# Patient Record
Sex: Female | Born: 1995 | Race: White | Hispanic: No | Marital: Single | Smoking: Never smoker
Health system: Southern US, Community
[De-identification: ages and names within clinical notes are randomized; demographics above are authoritative.]

## PROBLEM LIST (undated history)

## (undated) ENCOUNTER — Emergency Department: Discharge status: Absent without leave | Payer: Medicare Other

## (undated) ENCOUNTER — Inpatient Hospital Stay: Payer: Self-pay

## (undated) DIAGNOSIS — H9190 Unspecified hearing loss, unspecified ear: Secondary | ICD-10-CM

## (undated) DIAGNOSIS — H9191 Unspecified hearing loss, right ear: Secondary | ICD-10-CM

## (undated) HISTORY — PX: COCHLEAR IMPLANT: SUR684

## (undated) HISTORY — PX: WISDOM TOOTH EXTRACTION: SHX21

## (undated) HISTORY — DX: Unspecified hearing loss, right ear: H91.91

## (undated) HISTORY — DX: Unspecified hearing loss, unspecified ear: H91.90

---
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
## 2015-02-28 ENCOUNTER — Emergency Department
Admission: EM | Admit: 2015-02-28 | Discharge: 2015-02-28 | Disposition: A | Discharge status: Home / Self Care | Payer: Medicaid Other | Attending: Emergency Medicine | Admitting: Emergency Medicine

## 2015-02-28 ENCOUNTER — Encounter: Payer: Self-pay | Admitting: *Deleted

## 2015-02-28 DIAGNOSIS — Z3A01 Less than 8 weeks gestation of pregnancy: Secondary | ICD-10-CM | POA: Insufficient documentation

## 2015-02-28 DIAGNOSIS — K5900 Constipation, unspecified: Secondary | ICD-10-CM | POA: Diagnosis not present

## 2015-02-28 DIAGNOSIS — O99611 Diseases of the digestive system complicating pregnancy, first trimester: Secondary | ICD-10-CM | POA: Diagnosis not present

## 2015-02-28 DIAGNOSIS — O2341 Unspecified infection of urinary tract in pregnancy, first trimester: Secondary | ICD-10-CM | POA: Diagnosis not present

## 2015-02-28 DIAGNOSIS — N390 Urinary tract infection, site not specified: Secondary | ICD-10-CM

## 2015-02-28 DIAGNOSIS — O210 Mild hyperemesis gravidarum: Secondary | ICD-10-CM | POA: Diagnosis not present

## 2015-02-28 DIAGNOSIS — Z3490 Encounter for supervision of normal pregnancy, unspecified, unspecified trimester: Secondary | ICD-10-CM

## 2015-02-28 DIAGNOSIS — R112 Nausea with vomiting, unspecified: Secondary | ICD-10-CM

## 2015-02-28 LAB — CBC WITH DIFFERENTIAL/PLATELET
Basophils Absolute: 0.1 10*3/uL (ref 0–0.1)
Basophils Relative: 1 %
EOS ABS: 0 10*3/uL (ref 0–0.7)
Eosinophils Relative: 0 %
HCT: 40.7 % (ref 35.0–47.0)
Hemoglobin: 13.7 g/dL (ref 12.0–16.0)
LYMPHS ABS: 1.2 10*3/uL (ref 1.0–3.6)
Lymphocytes Relative: 9 %
MCH: 30.2 pg (ref 26.0–34.0)
MCHC: 33.6 g/dL (ref 32.0–36.0)
MCV: 90 fL (ref 80.0–100.0)
MONOS PCT: 6 %
Monocytes Absolute: 0.8 10*3/uL (ref 0.2–0.9)
Neutro Abs: 11.7 10*3/uL — ABNORMAL HIGH (ref 1.4–6.5)
Neutrophils Relative %: 84 %
Platelets: 277 10*3/uL (ref 150–440)
RBC: 4.52 MIL/uL (ref 3.80–5.20)
RDW: 13.7 % (ref 11.5–14.5)
WBC: 13.9 10*3/uL — AB (ref 3.6–11.0)

## 2015-02-28 LAB — HCG, QUANTITATIVE, PREGNANCY: HCG, BETA CHAIN, QUANT, S: 92438 m[IU]/mL — AB (ref <5)

## 2015-02-28 LAB — URINALYSIS COMPLETE WITH MICROSCOPIC (ARMC ONLY)
Bilirubin Urine: NEGATIVE
GLUCOSE, UA: NEGATIVE mg/dL
Hgb urine dipstick: NEGATIVE
Nitrite: NEGATIVE
PROTEIN: 30 mg/dL — AB
Specific Gravity, Urine: 1.028 (ref 1.005–1.030)
pH: 6 (ref 5.0–8.0)

## 2015-02-28 LAB — LIPASE, BLOOD: Lipase: 17 U/L (ref 11–51)

## 2015-02-28 LAB — COMPREHENSIVE METABOLIC PANEL
ALK PHOS: 48 U/L (ref 38–126)
ALT: 12 U/L — AB (ref 14–54)
AST: 14 U/L — AB (ref 15–41)
Albumin: 4.3 g/dL (ref 3.5–5.0)
Anion gap: 5 (ref 5–15)
BUN: 8 mg/dL (ref 6–20)
CALCIUM: 9.6 mg/dL (ref 8.9–10.3)
CO2: 27 mmol/L (ref 22–32)
CREATININE: 0.53 mg/dL (ref 0.44–1.00)
Chloride: 104 mmol/L (ref 101–111)
GFR calc non Af Amer: >60 mL/min (ref >60)
GLUCOSE: 96 mg/dL (ref 65–99)
Potassium: 3.5 mmol/L (ref 3.5–5.1)
SODIUM: 136 mmol/L (ref 135–145)
Total Bilirubin: 1 mg/dL (ref 0.3–1.2)
Total Protein: 7.8 g/dL (ref 6.5–8.1)

## 2015-02-28 LAB — POCT PREGNANCY, URINE: PREG TEST UR: POSITIVE — AB

## 2015-02-28 MED ORDER — METOCLOPRAMIDE HCL 10 MG PO TABS: 10.0000 mg | ORAL_TABLET | Freq: Three times a day (TID) | ORAL | Status: DC | PRN

## 2015-02-28 MED ORDER — NITROFURANTOIN MONOHYD MACRO 100 MG PO CAPS: 100.0000 mg | ORAL_CAPSULE | Freq: Two times a day (BID) | ORAL | Status: AC

## 2015-02-28 NOTE — ED Provider Notes (Signed)
[facility] Emergency Department Provider Note    ____________________________________________  Time seen: 1515  I have reviewed the triage vital signs and the nursing notes.   HISTORY  Chief Complaint Constipation   History limited by: Not Limited   HPI Abigail Curry is a 20 y.o. female who presented to the emergency department today with primary concern of constipation. She states thatshe has been having a more difficult time with defecation the past roughly week. She states her last bowel movement was 2 days ago. She states that she had a small amount of stooling at that time. The patient has had some midline lower abdominal pain as well. In addition patient states she was thrown about the past 5 days. She has also had some dysuria. She denies any fevers.   History reviewed. No pertinent past medical history.  There are no active problems to display for this patient.   Past Surgical History  Procedure Laterality Date  . Cochlear implant      Current Outpatient Rx  Name  Route  Sig  Dispense  Refill  . metoCLOPramide (REGLAN) 10 MG tablet   Oral   Take 1 tablet (10 mg total) by mouth 3 (three) times daily as needed for nausea or vomiting.   90 tablet   1   . nitrofurantoin, macrocrystal-monohydrate, (MACROBID) 100 MG capsule   Oral   Take 1 capsule (100 mg total) by mouth 2 (two) times daily.   14 capsule   0     Allergies Review of patient's allergies indicates no known allergies.  No family history on file.  Social History Social History  Substance Use Topics  . Smoking status: Never Smoker   . Smokeless tobacco: None  . Alcohol Use: Yes    Review of Systems  Constitutional: Negative for fever. Cardiovascular: Negative for chest pain. Respiratory: Negative for shortness of breath. Gastrointestinal: Positive for lower abdominal pain, emesis. Positive for constipation. Neurological: Negative for headaches, focal  weakness or numbness.   10-point ROS otherwise negative.  ____________________________________________   PHYSICAL EXAM:  VITAL SIGNS: ED Triage Vitals  Enc Vitals Group     BP 02/28/15 1231 124/83 mmHg     Pulse Rate 02/28/15 1231 86     Resp 02/28/15 1231 20     Temp 02/28/15 1231 97.4 F (36.3 C)     Temp Source 02/28/15 1231 Oral     SpO2 02/28/15 1231 99 %     Weight 02/28/15 1231 160 lb (72.576 kg)     Height 02/28/15 1231 5\' 7"  (1.702 m)     Head Cir --      Peak Flow --      Pain Score 02/28/15 1235 7   Constitutional: Alert and oriented. Well appearing and in no distress. Eyes: Conjunctivae are normal. PERRL. Normal extraocular movements. ENT   Head: Normocephalic and atraumatic.   Nose: No congestion/rhinnorhea.   Mouth/Throat: Mucous membranes are moist.   Neck: No stridor. Hematological/Lymphatic/Immunilogical: No cervical lymphadenopathy. Cardiovascular: Normal rate, regular rhythm.  No murmurs, rubs, or gallops. Respiratory: Normal respiratory effort without tachypnea nor retractions. Breath sounds are clear and equal bilaterally. No wheezes/rales/rhonchi. Gastrointestinal: Soft and nontender. No distention. There is no CVA tenderness. Genitourinary: Deferred Musculoskeletal: Normal range of motion in all extremities. No joint effusions.  No lower extremity tenderness nor edema. Neurologic:  Normal speech and language. No gross focal neurologic deficits are appreciated.  Skin:  Skin is warm, dry and intact. No rash noted.  Psychiatric: Mood and affect are normal. Speech and behavior are normal. Patient exhibits appropriate insight and judgment.  ____________________________________________    LABS (pertinent positives/negatives)  Labs Reviewed  CBC WITH DIFFERENTIAL/PLATELET - Abnormal; Notable for the following:    WBC 13.9 (*)    Neutro Abs 11.7 (*)    All other components within normal limits  COMPREHENSIVE METABOLIC PANEL - Abnormal;  Notable for the following:    AST 14 (*)    ALT 12 (*)    All other components within normal limits  URINALYSIS COMPLETEWITH MICROSCOPIC ([facility] ONLY) - Abnormal; Notable for the following:    Color, Urine YELLOW (*)    APPearance HAZY (*)    Ketones, ur 1+ (*)    Protein, ur 30 (*)    Leukocytes, UA 1+ (*)    Bacteria, UA RARE (*)    Squamous Epithelial / LPF 0-5 (*)    All other components within normal limits  HCG, QUANTITATIVE, PREGNANCY - Abnormal; Notable for the following:    hCG, Beta Chain, Quant, S 92438 (*)    All other components within normal limits  POCT PREGNANCY, URINE - Abnormal; Notable for the following:    Preg Test, Ur POSITIVE (*)    All other components within normal limits  LIPASE, BLOOD     ____________________________________________   EKG  None  ____________________________________________    RADIOLOGY  None   ____________________________________________   PROCEDURES  Procedure(s) performed: None  Critical Care performed: No  ____________________________________________   INITIAL IMPRESSION / ASSESSMENT AND PLAN / ED COURSE  Pertinent labs & imaging results that were available during my care of the patient were reviewed by me and considered in my medical decision making (see chart for details).  Patient presented to the emergency department today because of concerns for constipation. On review of systems patient is also been having some nausea and lower abdominal pain. In addition she has had some dysuria. Notable findings include a positive pregnancy and UTI. Patient was unaware for pregnancy. I discussed with the patient the importance of prenatal vitamins and OB/GYN follow-up. Patient did not have any tenderness on exam was not complaining of any significant pain and thus I doubt ectopic likely at this point. Additionally patient has had no vaginal bleeding. Furthermore the patient does have a UTI which could explain some the  abdominal discomfort.   ____________________________________________   FINAL CLINICAL IMPRESSION(S) / ED DIAGNOSES  Final diagnoses:  Pregnancy  Nausea and vomiting, vomiting of unspecified type  UTI (lower urinary tract infection)     Phineas Semen, MD 02/28/15 1528

## 2015-02-28 NOTE — Discharge Instructions (Signed)
As we discussed it is very important that you follow up with Ob/Gyn and start taking prenatal vitamins. Please seek medical attention for any high fevers, chest pain, shortness of breath, change in behavior, persistent vomiting, bloody stool or any other new or concerning symptoms.   Pregnancy and Urinary Tract Infection A urinary tract infection (UTI) is a bacterial infection of the urinary tract. Infection of the urinary tract can include the ureters, kidneys (pyelonephritis), bladder (cystitis), and urethra (urethritis). All pregnant women should be screened for bacteria in the urinary tract. Identifying and treating a UTI will decrease the risk of preterm labor and developing more serious infections in both the mother and baby. CAUSES Bacteria germs cause almost all UTIs.  RISK FACTORS Many factors can increase your chances of getting a UTI during pregnancy. These include:  Having a short urethra.  Poor toilet and hygiene habits.  Sexual intercourse.  Blockage of urine along the urinary tract.  Problems with the pelvic muscles or nerves.  Diabetes.  Obesity.  Bladder problems after having several children.  Previous history of UTI. SIGNS AND SYMPTOMS   Pain, burning, or a stinging feeling when urinating.  Suddenly feeling the need to urinate right away (urgency).  Loss of bladder control (urinary incontinence).  Frequent urination, more than is common with pregnancy.  Lower abdominal or back discomfort.  Cloudy urine.  Blood in the urine (hematuria).  Fever. When the kidneys are infected, the symptoms may be:  Back pain.  Flank pain on the right side more so than the left.  Fever.  Chills.  Nausea.  Vomiting. DIAGNOSIS  A urinary tract infection is usually diagnosed through urine tests. Additional tests and procedures are sometimes done. These may include:  Ultrasound exam of the kidneys, ureters, bladder, and urethra.  Looking in the bladder with a  lighted tube (cystoscopy). TREATMENT Typically, UTIs can be treated with antibiotic medicines.  HOME CARE INSTRUCTIONS   Only take over-the-counter or prescription medicines as directed by your health care provider. If you were prescribed antibiotics, take them as directed. Finish them even if you start to feel better.  Drink enough fluids to keep your urine clear or pale yellow.  Do not have sexual intercourse until the infection is gone and your health care provider says it is okay.  Make sure you are tested for UTIs throughout your pregnancy. These infections often come back. Preventing a UTI in the Future  Practice good toilet habits. Always wipe from front to back. Use the tissue only once.  Do not hold your urine. Empty your bladder as soon as possible when the urge comes.  Do not douche or use deodorant sprays.  Wash with soap and warm water around the genital area and the anus.  Empty your bladder before and after sexual intercourse.  Wear underwear with a cotton crotch.  Avoid caffeine and carbonated drinks. They can irritate the bladder.  Drink cranberry juice or take cranberry pills. This may decrease the risk of getting a UTI.  Do not drink alcohol.  Keep all your appointments and tests as scheduled. SEEK MEDICAL CARE IF:   Your symptoms get worse.  You are still having fevers 2 or more days after treatment begins.  You have a rash.  You feel that you are having problems with medicines prescribed.  You have abnormal vaginal discharge. SEEK IMMEDIATE MEDICAL CARE IF:   You have back or flank pain.  You have chills.  You have blood in your urine.  You have nausea and vomiting.  You have contractions of your uterus.  You have a gush of fluid from the vagina. MAKE SURE YOU:  Understand these instructions.   Will watch your condition.   Will get help right away if you are not doing well or get worse.    This information is not intended to  replace advice given to you by your health care provider. Make sure you discuss any questions you have with your health care provider.   Document Released: 06/02/2010 Document Revised: 11/26/2012 Document Reviewed: 09/04/2012 Elsevier Interactive Patient Education [employer]

## 2015-02-28 NOTE — ED Notes (Addendum)
Past month has had trouble with constipation, burps a lot, some vomited, laast bm 2 days ago was small amounts, some abd pain, states finally got a ride today, went to work , vomited multiple times

## 2015-02-28 NOTE — ED Notes (Signed)
Vomited several times today, has low abd soreness

## 2016-06-21 ENCOUNTER — Emergency Department
Admission: EM | Admit: 2016-06-21 | Discharge: 2016-06-21 | Disposition: A | Discharge status: Home / Self Care | Payer: Medicaid Other | Attending: Emergency Medicine | Admitting: Emergency Medicine

## 2016-06-21 DIAGNOSIS — R1032 Left lower quadrant pain: Secondary | ICD-10-CM | POA: Insufficient documentation

## 2016-06-21 DIAGNOSIS — Z79899 Other long term (current) drug therapy: Secondary | ICD-10-CM | POA: Insufficient documentation

## 2016-06-21 DIAGNOSIS — R197 Diarrhea, unspecified: Secondary | ICD-10-CM

## 2016-06-21 DIAGNOSIS — R1031 Right lower quadrant pain: Secondary | ICD-10-CM

## 2016-06-21 LAB — GASTROINTESTINAL PANEL BY PCR, STOOL (REPLACES STOOL CULTURE)

## 2016-06-21 LAB — COMPREHENSIVE METABOLIC PANEL
ALT: 13 U/L — ABNORMAL LOW (ref 14–54)
ANION GAP: 8 (ref 5–15)
AST: 16 U/L (ref 15–41)
Albumin: 3.6 g/dL (ref 3.5–5.0)
Alkaline Phosphatase: 59 U/L (ref 38–126)
BUN: 12 mg/dL (ref 6–20)
CHLORIDE: 107 mmol/L (ref 101–111)
CO2: 26 mmol/L (ref 22–32)
Calcium: 8.7 mg/dL — ABNORMAL LOW (ref 8.9–10.3)
Creatinine, Ser: 0.43 mg/dL — ABNORMAL LOW (ref 0.44–1.00)
GFR calc non Af Amer: >60 mL/min (ref >60)
Glucose, Bld: 96 mg/dL (ref 65–99)
POTASSIUM: 3.5 mmol/L (ref 3.5–5.1)
SODIUM: 141 mmol/L (ref 135–145)
Total Bilirubin: 0.4 mg/dL (ref 0.3–1.2)
Total Protein: 6.5 g/dL (ref 6.5–8.1)

## 2016-06-21 LAB — C DIFFICILE QUICK SCREEN W PCR REFLEX
C DIFFICILE (CDIFF) INTERP: NOT DETECTED
C DIFFICILE (CDIFF) TOXIN: NEGATIVE
C DIFFICLE (CDIFF) ANTIGEN: NEGATIVE

## 2016-06-21 LAB — POCT PREGNANCY, URINE: Preg Test, Ur: NEGATIVE

## 2016-06-21 LAB — CBC
HCT: 37.6 % (ref 35.0–47.0)
Hemoglobin: 13.2 g/dL (ref 12.0–16.0)
MCH: 31.9 pg (ref 26.0–34.0)
MCHC: 35.1 g/dL (ref 32.0–36.0)
MCV: 91 fL (ref 80.0–100.0)
PLATELETS: 258 10*3/uL (ref 150–440)
RBC: 4.13 MIL/uL (ref 3.80–5.20)
RDW: 13.7 % (ref 11.5–14.5)
WBC: 6.6 10*3/uL (ref 3.6–11.0)

## 2016-06-21 MED ORDER — LOPERAMIDE HCL 2 MG PO TABS: 2.0000 mg | ORAL_TABLET | Freq: Four times a day (QID) | ORAL | 0 refills | Status: DC | PRN

## 2016-06-21 NOTE — Discharge Instructions (Signed)
Please follow the instructions for food choices they can help relieve diarrhea. You may also use loperamide for diarrhea. Drink plenty of fluid to stay well-hydrated.  Return to the emergency department if you develop severe pain, fever, lightheadedness or fainting, or any other symptoms concerning to you.

## 2016-06-21 NOTE — ED Provider Notes (Signed)
[facility] Emergency Department Provider Note  ____________________________________________  Time seen: Approximately 7:42 AM  I have reviewed the triage vital signs and the nursing notes.   HISTORY  Chief Complaint Diarrhea and Emesis    HPI Abigail Curry is a 21 y.o. female , otherwise healthy, presenting with diarrheafor 2 weeks. The patient reports multiple daily episodes of loose stool or watery stool, now occasionally with some small blood streaks. Intermittently, she has associated lower abdominal cramping. She is not had any nausea or vomiting, fever or chills and has been eating and drinking normally. She is not tried any medications for her diarrhea. LMP was 2 days ago. She is not traveled outside the [place], nor been camping recently, no recent antibiotics or other medications.   History reviewed. No pertinent past medical history.  There are no active problems to display for this patient.   Past Surgical History:  Procedure Laterality Date  . COCHLEAR IMPLANT      Current Outpatient Rx  . Order #: [id number] Class: Print  . Order #: [id number] Class: Print    Allergies Patient has no known allergies.  No family history on file.  Social History Social History  Substance Use Topics  . Smoking status: Never Smoker  . Smokeless tobacco: Not on file  . Alcohol use Yes    Review of Systems Constitutional: No fever/chills.No lightheadedness or syncope. Eyes: No visual changes. ENT: No sore throat. No congestion or rhinorrhea. Cardiovascular: Denies chest pain. Denies palpitations. Respiratory: Denies shortness of breath.  No cough. Gastrointestinal: No abdominal pain.  No nausea, no vomiting.  Positive diarrhea.  No constipation. Genitourinary: Negative for dysuria. Musculoskeletal: Negative for back pain. Skin: Negative for rash. Neurological: Negative for headaches. No focal numbness, tingling or weakness.   10-point  ROS otherwise negative.  ____________________________________________   PHYSICAL EXAM:  VITAL SIGNS: ED Triage Vitals [06/21/16 0709]  Enc Vitals Group     BP 117/64     Pulse Rate 86     Resp 18     Temp 99.1 F (37.3 C)     Temp Source Oral     SpO2 96 %     Weight 160 lb (72.6 kg)     Height 5\' 11"  (1.803 m)     Head Circumference      Peak Flow      Pain Score 5     Pain Loc      Pain Edu?      Excl. in GC?     Constitutional: Alert and oriented. Well appearing and in no acute distress. Answers questions appropriately. Eyes: Conjunctivae are normal.  EOMI. No scleral icterus. Head: Atraumatic. Nose: No congestion/rhinnorhea. Mouth/Throat: Mucous membranes are moist.  Neck: No stridor.  Supple.   Cardiovascular: Normal rate, regular rhythm. No murmurs, rubs or gallops.  Respiratory: Normal respiratory effort.  No accessory muscle use or retractions. Lungs CTAB.  No wheezes, rales or ronchi. Gastrointestinal: Soft, nontender and nondistended.  No guarding or rebound.  No peritoneal signs. Musculoskeletal: No LE edema.  Neurologic:  A&Ox3.  Speech is clear.  Face and smile are symmetric.  EOMI.  Moves all extremities well. Skin:  Skin is warm, dry and intact. No rash noted. Psychiatric: Mood and affect are normal. Speech and behavior are normal.  Normal judgement.  ____________________________________________   LABS (all labs ordered are listed, but only abnormal results are displayed)  Labs Reviewed  COMPREHENSIVE METABOLIC PANEL - Abnormal; Notable for the following:  Result Value   Creatinine, Ser 0.43 (*)    Calcium 8.7 (*)    ALT 13 (*)    All other components within normal limits  GASTROINTESTINAL PANEL BY PCR, STOOL (REPLACES STOOL CULTURE)  C DIFFICILE QUICK SCREEN W PCR REFLEX  CBC  POC URINE PREG, ED  POCT PREGNANCY, URINE   ____________________________________________  EKG  Not  indicated ____________________________________________  RADIOLOGY  No results found.  ____________________________________________   PROCEDURES  Procedure(s) performed: None  Procedures  Critical Care performed: No ____________________________________________   INITIAL IMPRESSION / ASSESSMENT AND PLAN / ED COURSE  Pertinent labs & imaging results that were available during my care of the patient were reviewed by me and considered in my medical decision making (see chart for details).  21 y.o. female, otherwise healthy presenting with 2 weeks of watery diarrhea, with some blood specks. On my examination, the patient is afebrile and hemodynamically stable. She has a reassuring abdominal examination without any tenderness. We will do stool study to see if she has a bacterial pathogen or C. difficile, and depending on the results, I'll initiate her on an antidiarrheal medication. I do not think the patient has an acute intra-abdominal surgical pathology or infectious complication requiring imaging today. We will get electrolytes as well. Plan reevaluation for final disposition.  ----------------------------------------- 10:18 AM on 06/21/2016 -----------------------------------------  The patient has remained hemodynamically stable while in the emergency department. Her C. difficile testing as well as stool studies did not and follow-up instructions return any positive findings. Her electrolytes are within normal limits. White blood cell count is normal. At this time, the patient feels well and I'll plan to discharge her home with symptomatic treatment. She presents return precautions as well as follow up instructions.  ____________________________________________  FINAL CLINICAL IMPRESSION(S) / ED DIAGNOSES  Final diagnoses:  Diarrhea, unspecified type  Bilateral lower abdominal cramping         NEW MEDICATIONS STARTED DURING THIS VISIT:  New Prescriptions   LOPERAMIDE  (IMODIUM A-D) 2 MG TABLET    Take 1 tablet (2 mg total) by mouth 4 (four) times daily as needed for diarrhea or loose stools.      Rockne MenghiniAnne-Caroline Kaity Pitstick, MD 06/21/16 1019

## 2016-07-03 LAB — HM PAP SMEAR: HM Pap smear: NEGATIVE

## 2017-01-28 ENCOUNTER — Emergency Department
Admission: EM | Admit: 2017-01-28 | Discharge: 2017-01-28 | Disposition: A | Discharge status: Home / Self Care | Payer: Medicaid Other | Attending: Emergency Medicine | Admitting: Emergency Medicine

## 2017-01-28 ENCOUNTER — Other Ambulatory Visit: Payer: Self-pay

## 2017-01-28 ENCOUNTER — Encounter: Payer: Self-pay | Admitting: Emergency Medicine

## 2017-01-28 DIAGNOSIS — Z3491 Encounter for supervision of normal pregnancy, unspecified, first trimester: Secondary | ICD-10-CM | POA: Insufficient documentation

## 2017-01-28 DIAGNOSIS — Z3201 Encounter for pregnancy test, result positive: Secondary | ICD-10-CM | POA: Diagnosis not present

## 2017-01-28 DIAGNOSIS — Z3A00 Weeks of gestation of pregnancy not specified: Secondary | ICD-10-CM | POA: Insufficient documentation

## 2017-01-28 DIAGNOSIS — O210 Mild hyperemesis gravidarum: Secondary | ICD-10-CM | POA: Insufficient documentation

## 2017-01-28 DIAGNOSIS — O99511 Diseases of the respiratory system complicating pregnancy, first trimester: Secondary | ICD-10-CM | POA: Insufficient documentation

## 2017-01-28 DIAGNOSIS — J069 Acute upper respiratory infection, unspecified: Secondary | ICD-10-CM | POA: Diagnosis not present

## 2017-01-28 DIAGNOSIS — R112 Nausea with vomiting, unspecified: Secondary | ICD-10-CM

## 2017-01-28 LAB — COMPREHENSIVE METABOLIC PANEL
ALBUMIN: 3.9 g/dL (ref 3.5–5.0)
ALT: 14 U/L (ref 14–54)
ANION GAP: 11 (ref 5–15)
AST: 24 U/L (ref 15–41)
Alkaline Phosphatase: 44 U/L (ref 38–126)
BUN: 7 mg/dL (ref 6–20)
CO2: 21 mmol/L — AB (ref 22–32)
Calcium: 9.3 mg/dL (ref 8.9–10.3)
Chloride: 101 mmol/L (ref 101–111)
Creatinine, Ser: 0.47 mg/dL (ref 0.44–1.00)
GFR calc non Af Amer: >60 mL/min (ref >60)
Glucose, Bld: 178 mg/dL — ABNORMAL HIGH (ref 65–99)
Potassium: 3.1 mmol/L — ABNORMAL LOW (ref 3.5–5.1)
SODIUM: 133 mmol/L — AB (ref 135–145)
Total Bilirubin: 1.5 mg/dL — ABNORMAL HIGH (ref 0.3–1.2)
Total Protein: 6.7 g/dL (ref 6.5–8.1)

## 2017-01-28 LAB — CBC
HCT: 41 % (ref 35.0–47.0)
HEMOGLOBIN: 13.9 g/dL (ref 12.0–16.0)
MCH: 31.4 pg (ref 26.0–34.0)
MCHC: 34 g/dL (ref 32.0–36.0)
MCV: 92.5 fL (ref 80.0–100.0)
Platelets: 309 10*3/uL (ref 150–440)
RBC: 4.43 MIL/uL (ref 3.80–5.20)
RDW: 12.7 % (ref 11.5–14.5)
WBC: 12.3 10*3/uL — ABNORMAL HIGH (ref 3.6–11.0)

## 2017-01-28 LAB — URINALYSIS, COMPLETE (UACMP) WITH MICROSCOPIC
BILIRUBIN URINE: NEGATIVE
Glucose, UA: 50 mg/dL — AB
HGB URINE DIPSTICK: NEGATIVE
Ketones, ur: 80 mg/dL — AB
LEUKOCYTES UA: NEGATIVE
Nitrite: NEGATIVE
PROTEIN: 30 mg/dL — AB
Specific Gravity, Urine: 1.028 (ref 1.005–1.030)
pH: 5 (ref 5.0–8.0)

## 2017-01-28 LAB — LIPASE, BLOOD: LIPASE: 25 U/L (ref 11–51)

## 2017-01-28 LAB — HCG, QUANTITATIVE, PREGNANCY: HCG, BETA CHAIN, QUANT, S: 116268 m[IU]/mL — AB (ref <5)

## 2017-01-28 LAB — POCT PREGNANCY, URINE: Preg Test, Ur: POSITIVE — AB

## 2017-01-28 MED ORDER — RANITIDINE HCL 75 MG PO TABS: 75.0000 mg | ORAL_TABLET | Freq: Two times a day (BID) | ORAL | 3 refills | Status: DC

## 2017-01-28 MED ORDER — NITROFURANTOIN MONOHYD MACRO 100 MG PO CAPS: 100.0000 mg | ORAL_CAPSULE | Freq: Two times a day (BID) | ORAL | 0 refills | Status: DC

## 2017-01-28 MED ORDER — ONDANSETRON 4 MG PO TBDP: 4.0000 mg | ORAL_TABLET | Freq: Three times a day (TID) | ORAL | 0 refills | Status: DC | PRN

## 2017-01-28 MED ORDER — ONDANSETRON 4 MG PO TBDP
4.0000 mg | ORAL_TABLET | Freq: Once | ORAL | Status: AC
  Administered 2017-01-28: 4 mg via ORAL
  Filled 2017-01-28: qty 1

## 2017-01-28 NOTE — ED Triage Notes (Signed)
Pt is here for cough/congestion X 1 month with drainage and yellow sputum.  Denies pain in throat at this time. No fevers.  G2P0A1.  Had positive home pregnancy test.  Wants to know how far along baby is. Explained don't normally do that in ED but to call OBGYN tomorrow to set up appt and when they do the US they will give her better estimate.  Gave approximate due date from wheel based on LMP.  Denies vaginal bleeding. Last normal periord October, some spotting November, nothing since.  C/o vomiting every day but not sure if from cough and congestion or being pregnant.

## 2017-01-28 NOTE — ED Provider Notes (Signed)
[facility] Emergency Department Provider Note       Time seen: ----------------------------------------- 4:29 PM on 01/28/2017 -----------------------------------------   I have reviewed the triage vital signs and the nursing notes.  HISTORY   Chief Complaint Nasal Congestion; Cough; and Emesis    HPI Abigail Curry is a 21 y.o. female with no significant past medical history who presents to the ED for cough and congestion for the past month with drainage and yellow sputum.  She denies any pain in her throat at this time.  She has taken a home pregnancy test which was positive.  She wants to know how far along she is.  She is G1 P0.  She reports she been vomiting every day but she is not sure if this is from cough and congestion or for being pregnant.  History reviewed. No pertinent past medical history.  There are no active problems to display for this patient.   Past Surgical History:  Procedure Laterality Date  . COCHLEAR IMPLANT      Allergies Patient has no known allergies.  Social History Social History   Tobacco Use  . Smoking status: Never Smoker  . Smokeless tobacco: Never Used  Substance Use Topics  . Alcohol use: Yes  . Drug use: Not on file    Review of Systems Constitutional: Negative for fever. Eyes: Negative for vision changes ENT: Positive for cough congestion Cardiovascular: Negative for chest pain. Respiratory: Negative for shortness of breath. Gastrointestinal: Negative for abdominal pain, positive for vomiting Genitourinary: Negative for dysuria. Musculoskeletal: Negative for back pain. Skin: Negative for rash. Neurological: Negative for headaches, focal weakness or numbness.  All systems negative/normal/unremarkable except as stated in the HPI  ____________________________________________   PHYSICAL EXAM:  VITAL SIGNS: ED Triage Vitals  Enc Vitals Group     BP 01/28/17 1538 120/73     Pulse Rate  01/28/17 1538 88     Resp 01/28/17 1538 16     Temp 01/28/17 1538 97.6 F (36.4 C)     Temp Source 01/28/17 1538 Oral     SpO2 01/28/17 1538 98 %     Weight 01/28/17 1539 164 lb (74.4 kg)     Height 01/28/17 1539 5\' 7"  (1.702 m)     Head Circumference --      Peak Flow --      Pain Score --      Pain Loc --      Pain Edu? --      Excl. in GC? --     Constitutional: Alert and oriented. Well appearing and in no distress. Eyes: Conjunctivae are normal. Normal extraocular movements. ENT   Head: Normocephalic and atraumatic.   Nose: No congestion/rhinnorhea.   Mouth/Throat: Mucous membranes are moist.  No erythema   Neck: No stridor.  No adenopathy Cardiovascular: Normal rate, regular rhythm. No murmurs, rubs, or gallops. Respiratory: Normal respiratory effort without tachypnea nor retractions. Breath sounds are clear and equal bilaterally. No wheezes/rales/rhonchi. Gastrointestinal: Soft and nontender. Normal bowel sounds Musculoskeletal: Nontender with normal range of motion in extremities. No lower extremity tenderness nor edema. Neurologic:  Normal speech and language. No gross focal neurologic deficits are appreciated.  Skin:  Skin is warm, dry and intact. No rash noted. Psychiatric: Mood and affect are normal. Speech and behavior are normal.  ____________________________________________  ED COURSE:  Pertinent labs & imaging results that were available during my care of the patient were reviewed by me and considered in my medical decision  making (see chart for details). Patient presents for URI symptoms as well as pregnancy, we will assess with labs and imaging as indicated.   Procedures ____________________________________________   LABS (pertinent positives/negatives)  Labs Reviewed  COMPREHENSIVE METABOLIC PANEL - Abnormal; Notable for the following components:      Result Value   Sodium 133 (*)    Potassium 3.1 (*)    CO2 21 (*)    Glucose, Bld 178 (*)     Total Bilirubin 1.5 (*)    All other components within normal limits  CBC - Abnormal; Notable for the following components:   WBC 12.3 (*)    All other components within normal limits  URINALYSIS, COMPLETE (UACMP) WITH MICROSCOPIC - Abnormal; Notable for the following components:   Color, Urine YELLOW (*)    APPearance CLOUDY (*)    Glucose, UA 50 (*)    Ketones, ur 80 (*)    Protein, ur 30 (*)    Bacteria, UA RARE (*)    Squamous Epithelial / LPF 6-30 (*)    All other components within normal limits  HCG, QUANTITATIVE, PREGNANCY - Abnormal; Notable for the following components:   hCG, Beta Chain, Quant, S [id number] (*)    All other components within normal limits  POCT PREGNANCY, URINE - Abnormal; Notable for the following components:   Preg Test, Ur POSITIVE (*)    All other components within normal limits  LIPASE, BLOOD  POC URINE PREG, ED  ____________________________________________  DIFFERENTIAL DIAGNOSIS   URI, GERD, normal pregnancy, influenza, pneumonia  FINAL ASSESSMENT AND PLAN  URI, first trimester pregnancy   Plan: Patient had presented for cough and congestion with intermittent vomiting in pregnancy. Patient's labs revealed likely 6-8-week pregnancy.  I will prescribe some antiemetics for her as well as antacids.  She is otherwise stable for outpatient follow-up.   Emily FilbertWilliams, Karma Ansley E, MD   Note: This note was generated in part or whole with voice recognition software. Voice recognition is usually quite accurate but there are transcription errors that can and very often do occur. I apologize for any typographical errors that were not detected and corrected.     Emily FilbertWilliams, Samba Cumba E, MD 01/28/17 Rickey [id number]

## 2017-01-30 LAB — HM HIV SCREENING LAB: HM HIV Screening: NEGATIVE

## 2018-05-10 ENCOUNTER — Encounter
Admission: EM | Disposition: A | Discharge status: Home / Self Care | Payer: Self-pay | Source: Home / Self Care | Attending: Emergency Medicine

## 2018-05-10 ENCOUNTER — Emergency Department: Payer: Medicaid Other | Admitting: Anesthesiology

## 2018-05-10 ENCOUNTER — Emergency Department
Admission: EM | Admit: 2018-05-10 | Discharge: 2018-05-10 | Disposition: A | Discharge status: Home / Self Care | Payer: Medicaid Other | Attending: Emergency Medicine | Admitting: Emergency Medicine

## 2018-05-10 ENCOUNTER — Other Ambulatory Visit: Payer: Self-pay

## 2018-05-10 ENCOUNTER — Emergency Department: Payer: Medicaid Other

## 2018-05-10 DIAGNOSIS — K661 Hemoperitoneum: Secondary | ICD-10-CM | POA: Diagnosis not present

## 2018-05-10 DIAGNOSIS — K660 Peritoneal adhesions (postprocedural) (postinfection): Secondary | ICD-10-CM | POA: Diagnosis not present

## 2018-05-10 DIAGNOSIS — O00102 Left tubal pregnancy without intrauterine pregnancy: Secondary | ICD-10-CM | POA: Diagnosis present

## 2018-05-10 DIAGNOSIS — Z79899 Other long term (current) drug therapy: Secondary | ICD-10-CM | POA: Diagnosis not present

## 2018-05-10 HISTORY — PX: DIAGNOSTIC LAPAROSCOPY WITH REMOVAL OF ECTOPIC PREGNANCY: SHX6449

## 2018-05-10 LAB — COMPREHENSIVE METABOLIC PANEL
ALT: 13 U/L (ref 0–44)
AST: 14 U/L — AB (ref 15–41)
Albumin: 3.8 g/dL (ref 3.5–5.0)
Alkaline Phosphatase: 52 U/L (ref 38–126)
Anion gap: 10 (ref 5–15)
BUN: 9 mg/dL (ref 6–20)
CO2: 22 mmol/L (ref 22–32)
Calcium: 8.6 mg/dL — ABNORMAL LOW (ref 8.9–10.3)
Chloride: 106 mmol/L (ref 98–111)
Creatinine, Ser: 0.51 mg/dL (ref 0.44–1.00)
GFR calc Af Amer: >60 mL/min (ref >60)
GFR calc non Af Amer: >60 mL/min (ref >60)
Glucose, Bld: 129 mg/dL — ABNORMAL HIGH (ref 70–99)
Potassium: 3.5 mmol/L (ref 3.5–5.1)
SODIUM: 138 mmol/L (ref 135–145)
Total Bilirubin: 0.8 mg/dL (ref 0.3–1.2)
Total Protein: 6.5 g/dL (ref 6.5–8.1)

## 2018-05-10 LAB — URINALYSIS, COMPLETE (UACMP) WITH MICROSCOPIC
Bacteria, UA: NONE SEEN
Bilirubin Urine: NEGATIVE
Glucose, UA: NEGATIVE mg/dL
KETONES UR: 20 mg/dL — AB
Nitrite: NEGATIVE
PROTEIN: 30 mg/dL — AB
Specific Gravity, Urine: 1.024 (ref 1.005–1.030)
pH: 5 (ref 5.0–8.0)

## 2018-05-10 LAB — CBC
HCT: 34.8 % — ABNORMAL LOW (ref 36.0–46.0)
Hemoglobin: 11.8 g/dL — ABNORMAL LOW (ref 12.0–15.0)
MCH: 29.9 pg (ref 26.0–34.0)
MCHC: 33.9 g/dL (ref 30.0–36.0)
MCV: 88.1 fL (ref 80.0–100.0)
Platelets: 232 10*3/uL (ref 150–400)
RBC: 3.95 MIL/uL (ref 3.87–5.11)
RDW: 15.2 % (ref 11.5–15.5)
WBC: 14.8 10*3/uL — ABNORMAL HIGH (ref 4.0–10.5)
nRBC: 0 % (ref 0.0–0.2)

## 2018-05-10 LAB — CBC WITH DIFFERENTIAL/PLATELET
Abs Immature Granulocytes: 0.11 10*3/uL — ABNORMAL HIGH (ref 0.00–0.07)
BASOS ABS: 0.1 10*3/uL (ref 0.0–0.1)
Basophils Relative: 0 %
Eosinophils Absolute: 0 10*3/uL (ref 0.0–0.5)
Eosinophils Relative: 0 %
HCT: 41.3 % (ref 36.0–46.0)
Hemoglobin: 13.6 g/dL (ref 12.0–15.0)
IMMATURE GRANULOCYTES: 1 %
Lymphocytes Relative: 8 %
Lymphs Abs: 1.5 10*3/uL (ref 0.7–4.0)
MCH: 29.3 pg (ref 26.0–34.0)
MCHC: 32.9 g/dL (ref 30.0–36.0)
MCV: 89 fL (ref 80.0–100.0)
Monocytes Absolute: 0.7 10*3/uL (ref 0.1–1.0)
Monocytes Relative: 4 %
NEUTROS PCT: 87 %
Neutro Abs: 16.4 10*3/uL — ABNORMAL HIGH (ref 1.7–7.7)
Platelets: 351 10*3/uL (ref 150–400)
RBC: 4.64 MIL/uL (ref 3.87–5.11)
RDW: 15.3 % (ref 11.5–15.5)
WBC: 18.7 10*3/uL — ABNORMAL HIGH (ref 4.0–10.5)
nRBC: 0 % (ref 0.0–0.2)

## 2018-05-10 LAB — TYPE AND SCREEN
ABO/RH(D): O POS
Antibody Screen: NEGATIVE

## 2018-05-10 LAB — LIPASE, BLOOD: Lipase: 18 U/L (ref 11–51)

## 2018-05-10 LAB — HCG, QUANTITATIVE, PREGNANCY: HCG, BETA CHAIN, QUANT, S: 5689 m[IU]/mL — AB (ref <5)

## 2018-05-10 LAB — POCT PREGNANCY, URINE: Preg Test, Ur: POSITIVE — AB

## 2018-05-10 SURGERY — LAPAROSCOPY, WITH ECTOPIC PREGNANCY SURGICAL TREATMENT
Anesthesia: General | Laterality: Left

## 2018-05-10 MED ORDER — SUCCINYLCHOLINE CHLORIDE 20 MG/ML IJ SOLN
INTRAMUSCULAR | Status: DC | PRN
  Administered 2018-05-10: 100 mg via INTRAVENOUS

## 2018-05-10 MED ORDER — BUPIVACAINE HCL (PF) 0.5 % IJ SOLN
INTRAMUSCULAR | Status: AC
  Filled 2018-05-10: qty 30

## 2018-05-10 MED ORDER — ONDANSETRON HCL 4 MG/2ML IJ SOLN: 4.0000 mg | Freq: Once | INTRAMUSCULAR | Status: DC | PRN

## 2018-05-10 MED ORDER — DEXMEDETOMIDINE HCL IN NACL 80 MCG/20ML IV SOLN
INTRAVENOUS | Status: AC
  Filled 2018-05-10: qty 20

## 2018-05-10 MED ORDER — SUCCINYLCHOLINE CHLORIDE 20 MG/ML IJ SOLN
INTRAMUSCULAR | Status: AC
  Filled 2018-05-10: qty 1

## 2018-05-10 MED ORDER — FENTANYL CITRATE (PF) 100 MCG/2ML IJ SOLN
INTRAMUSCULAR | Status: DC | PRN
  Administered 2018-05-10: 100 ug via INTRAVENOUS
  Administered 2018-05-10: 50 ug via INTRAVENOUS
  Administered 2018-05-10: 100 ug via INTRAVENOUS

## 2018-05-10 MED ORDER — FENTANYL CITRATE (PF) 100 MCG/2ML IJ SOLN: 25.0000 ug | INTRAMUSCULAR | Status: DC | PRN

## 2018-05-10 MED ORDER — IBUPROFEN 800 MG PO TABS: 800.0000 mg | ORAL_TABLET | Freq: Three times a day (TID) | ORAL | 1 refills | Status: AC

## 2018-05-10 MED ORDER — OXYCODONE HCL 5 MG PO CAPS: 5.0000 mg | ORAL_CAPSULE | Freq: Four times a day (QID) | ORAL | 0 refills | Status: DC | PRN

## 2018-05-10 MED ORDER — ROCURONIUM BROMIDE 100 MG/10ML IV SOLN
INTRAVENOUS | Status: DC | PRN
  Administered 2018-05-10: 40 mg via INTRAVENOUS

## 2018-05-10 MED ORDER — PROPOFOL 10 MG/ML IV BOLUS
INTRAVENOUS | Status: DC | PRN
  Administered 2018-05-10: 50 mg via INTRAVENOUS
  Administered 2018-05-10: 150 mg via INTRAVENOUS

## 2018-05-10 MED ORDER — ONDANSETRON HCL 4 MG/2ML IJ SOLN
INTRAMUSCULAR | Status: DC | PRN
  Administered 2018-05-10: 4 mg via INTRAVENOUS

## 2018-05-10 MED ORDER — MIDAZOLAM HCL 2 MG/2ML IJ SOLN
INTRAMUSCULAR | Status: DC | PRN
  Administered 2018-05-10: 2 mg via INTRAVENOUS

## 2018-05-10 MED ORDER — DEXAMETHASONE SODIUM PHOSPHATE 10 MG/ML IJ SOLN
INTRAMUSCULAR | Status: DC | PRN
  Administered 2018-05-10: 8 mg via INTRAVENOUS

## 2018-05-10 MED ORDER — BUPIVACAINE HCL 0.5 % IJ SOLN
INTRAMUSCULAR | Status: DC | PRN
  Administered 2018-05-10: 9 mL

## 2018-05-10 MED ORDER — DOCUSATE SODIUM 100 MG PO CAPS: 100.0000 mg | ORAL_CAPSULE | Freq: Two times a day (BID) | ORAL | 0 refills | Status: DC

## 2018-05-10 MED ORDER — ACETAMINOPHEN 500 MG PO TABS: 1000.0000 mg | ORAL_TABLET | Freq: Four times a day (QID) | ORAL | 0 refills | Status: AC

## 2018-05-10 MED ORDER — LIDOCAINE HCL (CARDIAC) PF 100 MG/5ML IV SOSY
PREFILLED_SYRINGE | INTRAVENOUS | Status: DC | PRN
  Administered 2018-05-10: 100 mg via INTRAVENOUS

## 2018-05-10 MED ORDER — SUGAMMADEX SODIUM 200 MG/2ML IV SOLN
INTRAVENOUS | Status: AC
  Filled 2018-05-10: qty 2

## 2018-05-10 MED ORDER — PROPOFOL 10 MG/ML IV BOLUS
INTRAVENOUS | Status: AC
  Filled 2018-05-10: qty 20

## 2018-05-10 MED ORDER — LACTATED RINGERS IV SOLN
INTRAVENOUS | Status: DC
  Administered 2018-05-10: 50 mL/h via INTRAVENOUS

## 2018-05-10 MED ORDER — LIDOCAINE HCL 4 % MT SOLN
OROMUCOSAL | Status: DC | PRN
  Administered 2018-05-10: 4 mL via TOPICAL

## 2018-05-10 MED ORDER — FENTANYL CITRATE (PF) 250 MCG/5ML IJ SOLN
INTRAMUSCULAR | Status: AC
  Filled 2018-05-10: qty 5

## 2018-05-10 MED ORDER — MORPHINE SULFATE (PF) 2 MG/ML IV SOLN
2.0000 mg | Freq: Once | INTRAVENOUS | Status: AC
  Administered 2018-05-10: 2 mg via INTRAVENOUS
  Filled 2018-05-10: qty 1

## 2018-05-10 MED ORDER — DEXMEDETOMIDINE HCL 200 MCG/2ML IV SOLN
INTRAVENOUS | Status: DC | PRN
  Administered 2018-05-10: 12 ug via INTRAVENOUS
  Administered 2018-05-10: 8 ug via INTRAVENOUS

## 2018-05-10 MED ORDER — LACTATED RINGERS IV SOLN
INTRAVENOUS | Status: DC | PRN
  Administered 2018-05-10 (×2): via INTRAVENOUS

## 2018-05-10 MED ORDER — OXYCODONE HCL 5 MG PO TABS
5.0000 mg | ORAL_TABLET | Freq: Four times a day (QID) | ORAL | Status: DC | PRN
  Administered 2018-05-10: 5 mg via ORAL

## 2018-05-10 MED ORDER — MIDAZOLAM HCL 2 MG/2ML IJ SOLN
INTRAMUSCULAR | Status: AC
  Filled 2018-05-10: qty 2

## 2018-05-10 MED ORDER — LIDOCAINE HCL (PF) 2 % IJ SOLN
INTRAMUSCULAR | Status: AC
  Filled 2018-05-10: qty 10

## 2018-05-10 MED ORDER — SUGAMMADEX SODIUM 200 MG/2ML IV SOLN
INTRAVENOUS | Status: DC | PRN
  Administered 2018-05-10: 400 mg via INTRAVENOUS

## 2018-05-10 MED ORDER — OXYCODONE HCL 5 MG PO TABS
ORAL_TABLET | ORAL | Status: AC
  Filled 2018-05-10: qty 1

## 2018-05-10 MED ORDER — DEXAMETHASONE SODIUM PHOSPHATE 4 MG/ML IJ SOLN
INTRAMUSCULAR | Status: AC
  Filled 2018-05-10: qty 1

## 2018-05-10 MED ORDER — ONDANSETRON HCL 4 MG/2ML IJ SOLN
INTRAMUSCULAR | Status: AC
  Filled 2018-05-10: qty 2

## 2018-05-10 MED ORDER — ONDANSETRON HCL 4 MG/2ML IJ SOLN
4.0000 mg | Freq: Once | INTRAMUSCULAR | Status: AC
  Administered 2018-05-10: 4 mg via INTRAVENOUS
  Filled 2018-05-10: qty 2

## 2018-05-10 SURGICAL SUPPLY — 44 items
BAG URINE DRAINAGE (UROLOGICAL SUPPLIES) ×3 IMPLANT
BLADE SURG SZ11 CARB STEEL (BLADE) ×3 IMPLANT
CATH FOLEY 2WAY  5CC 16FR (CATHETERS) ×2
CATH URTH 16FR FL 2W BLN LF (CATHETERS) ×1 IMPLANT
CHLORAPREP W/TINT 26 (MISCELLANEOUS) ×3 IMPLANT
CLOSURE WOUND 1/4X4 (GAUZE/BANDAGES/DRESSINGS)
COVER WAND RF STERILE (DRAPES) IMPLANT
DERMABOND ADVANCED (GAUZE/BANDAGES/DRESSINGS) ×2
DERMABOND ADVANCED .7 DNX12 (GAUZE/BANDAGES/DRESSINGS) ×1 IMPLANT
DRAPE GENERAL ENDO 106X123.5 (DRAPES) ×3 IMPLANT
DRAPE LEGGINS SURG 28X43 STRL (DRAPES) ×3 IMPLANT
DRAPE UNDER BUTTOCK W/FLU (DRAPES) ×3 IMPLANT
GLOVE BIO SURGEON STRL SZ7 (GLOVE) ×6 IMPLANT
GLOVE INDICATOR 7.5 STRL GRN (GLOVE) ×3 IMPLANT
GOWN STRL REUS W/ TWL LRG LVL3 (GOWN DISPOSABLE) ×2 IMPLANT
GOWN STRL REUS W/TWL LRG LVL3 (GOWN DISPOSABLE) ×4
IRRIGATION STRYKERFLOW (MISCELLANEOUS) ×1 IMPLANT
IRRIGATOR STRYKERFLOW (MISCELLANEOUS) ×3
IV NS 1000ML (IV SOLUTION) ×2
IV NS 1000ML BAXH (IV SOLUTION) ×1 IMPLANT
KIT PINK PAD W/HEAD ARE REST (MISCELLANEOUS) ×3
KIT PINK PAD W/HEAD ARM REST (MISCELLANEOUS) ×1 IMPLANT
KIT TURNOVER CYSTO (KITS) ×3 IMPLANT
LABEL OR SOLS (LABEL) ×3 IMPLANT
LIGASURE LAP MARYLAND 5MM 37CM (ELECTROSURGICAL) IMPLANT
NEEDLE FILTER BLUNT 18X 1/2SAF (NEEDLE) ×2
NEEDLE FILTER BLUNT 18X1 1/2 (NEEDLE) ×1 IMPLANT
NS IRRIG 500ML POUR BTL (IV SOLUTION) ×3 IMPLANT
PACK GYN LAPAROSCOPIC (MISCELLANEOUS) ×3 IMPLANT
PAD OB MATERNITY 4.3X12.25 (PERSONAL CARE ITEMS) ×3 IMPLANT
PAD PREP 24X41 OB/GYN DISP (PERSONAL CARE ITEMS) ×3 IMPLANT
POUCH SPECIMEN RETRIEVAL 10MM (ENDOMECHANICALS) ×3 IMPLANT
SCISSORS METZENBAUM CVD 33 (INSTRUMENTS) IMPLANT
SET TUBE SMOKE EVAC HIGH FLOW (TUBING) ×3 IMPLANT
SLEEVE ENDOPATH XCEL 5M (ENDOMECHANICALS) ×3 IMPLANT
STRIP CLOSURE SKIN 1/4X4 (GAUZE/BANDAGES/DRESSINGS) IMPLANT
SUT MNCRL AB 4-0 PS2 18 (SUTURE) ×3 IMPLANT
SUT VIC AB 2-0 UR6 27 (SUTURE) ×3 IMPLANT
SUT VIC AB 4-0 SH 27 (SUTURE) ×2
SUT VIC AB 4-0 SH 27XANBCTRL (SUTURE) ×1 IMPLANT
SYR 50ML LL SCALE MARK (SYRINGE) IMPLANT
SYR 5ML LL (SYRINGE) ×3 IMPLANT
TROCAR XCEL NON-BLD 5MMX100MML (ENDOMECHANICALS) ×3 IMPLANT
TUBING ART PRESS 48 MALE/FEM (TUBING) IMPLANT

## 2018-05-10 NOTE — ED Notes (Addendum)
Dr Manson Passey notified of UPREG POS, [place] called for scan

## 2018-05-10 NOTE — ED Provider Notes (Addendum)
[facility] Emergency Department Provider Note    None    (approximate)  I have reviewed the triage vital signs and the nursing notes.   HISTORY  Chief Complaint Pelvic Pain    HPI Abigail Curry is a 23 y.o. female G1 P1 last menstrual period 2 weeks ago presents to the emergency department with cute onset of pelvic pain and vaginal bleeding with associated vomiting that began tonight before arrival.   Patient states that current pain score is 10 out of 10.  Patient denies any aggravating or alleviating factors.  Patient denies any urinary symptoms.  Patient denies any fever.  Patient states that she has not been sexually active for "a long time".        No past medical history on file.  There are no active problems to display for this patient.   Past Surgical History:  Procedure Laterality Date  . COCHLEAR IMPLANT      Prior to Admission medications   Medication Sig Start Date End Date Taking? Authorizing Provider  loperamide (IMODIUM A-D) 2 MG tablet Take 1 tablet (2 mg total) by mouth 4 (four) times daily as needed for diarrhea or loose stools. Patient not taking: Reported on 01/28/2017 06/21/16   Rockne Menghini, MD  metoCLOPramide (REGLAN) 10 MG tablet Take 1 tablet (10 mg total) by mouth 3 (three) times daily as needed for nausea or vomiting. Patient not taking: Reported on 06/21/2016 02/28/15 02/28/16  Phineas Semen, MD  nitrofurantoin, macrocrystal-monohydrate, (MACROBID) 100 MG capsule Take 1 capsule (100 mg total) by mouth 2 (two) times daily. 01/28/17   Emily Filbert, MD  ondansetron (ZOFRAN ODT) 4 MG disintegrating tablet Take 1 tablet (4 mg total) by mouth every 8 (eight) hours as needed for nausea or vomiting. 01/28/17   Emily Filbert, MD  ranitidine (ZANTAC 75) 75 MG tablet Take 1 tablet (75 mg total) by mouth 2 (two) times daily. 01/28/17   Emily Filbert, MD    Allergies Patient has no known allergies.   No family history on file.  Social History Social History   Tobacco Use  . Smoking status: Never Smoker  . Smokeless tobacco: Never Used  Substance Use Topics  . Alcohol use: Yes  . Drug use: Not on file    Review of Systems Constitutional: No fever/chills Eyes: No visual changes. ENT: No sore throat. Cardiovascular: Denies chest pain. Respiratory: Denies shortness of breath. Gastrointestinal: Positive for pelvic pain and vomiting.  No diarrhea.  No constipation. Genitourinary: Positive for vaginal bleeding Musculoskeletal: Negative for neck pain.  Negative for back pain. Integumentary: Negative for rash. Neurological: Negative for headaches, focal weakness or numbness.   ____________________________________________   PHYSICAL EXAM:  VITAL SIGNS: ED Triage Vitals  Enc Vitals Group     BP 05/10/18 0421 116/76     Pulse Rate 05/10/18 0421 (!) 104     Resp 05/10/18 0421 16     Temp 05/10/18 0421 98.5 F (36.9 C)     Temp Source 05/10/18 0421 Oral     SpO2 05/10/18 0421 100 %     Weight 05/10/18 0422 81.2 kg (179 lb)     Height 05/10/18 0422 1.702 m ( )     Head Circumference --      Peak Flow --      Pain Score 05/10/18 0422 8     Pain Loc --      Pain Edu? --      Excl.  in GC? --     Constitutional: Alert and oriented.  Parent discomfort  eyes: Conjunctivae are normal.  Mouth/Throat: Mucous membranes are moist.  Oropharynx non-erythematous. Neck: No stridor.   Cardiovascular: Normal rate, regular rhythm. Good peripheral circulation. Grossly normal heart sounds. Respiratory: Normal respiratory effort.  No retractions. Lungs CTAB. Gastrointestinal: Generalized tenderness to palpation. No distention.  Musculoskeletal: No lower extremity tenderness nor edema. No gross deformities of extremities. Neurologic:  Normal speech and language. No gross focal neurologic deficits are appreciated.  Skin:  Skin is warm, dry and intact. No rash noted. Psychiatric:  Mood and affect are normal. Speech and behavior are normal.  ____________________________________________   LABS (all labs ordered are listed, but only abnormal results are displayed)  Labs Reviewed  CBC WITH DIFFERENTIAL/PLATELET - Abnormal; Notable for the following components:      Result Value   WBC 18.7 (*)    Neutro Abs 16.4 (*)    Abs Immature Granulocytes 0.11 (*)    All other components within normal limits  COMPREHENSIVE METABOLIC PANEL - Abnormal; Notable for the following components:   Glucose, Bld 129 (*)    Calcium 8.6 (*)    AST 14 (*)    All other components within normal limits  URINALYSIS, COMPLETE (UACMP) WITH MICROSCOPIC  HCG, QUANTITATIVE, PREGNANCY  POC URINE PREG, ED     RADIOLOGY I, [place] Erina Hamme, personally viewed and evaluated these images (plain radiographs) as part of my medical decision making, as well as reviewing the written report by the radiologist.  ED MD interpretation: Left ectopic pregnancy with free fluid.  Official radiology report(s): No results found.    .Critical Care Performed by: Darci Current, MD Authorized by: Darci Current, MD   Critical care provider statement:    Critical care time (minutes):  30   Critical care time was exclusive of:  Separately billable procedures and treating other patients (Ruptured ectopic pregnancy)   Critical care was time spent personally by me on the following activities:  Development of treatment plan with patient or surrogate, discussions with consultants, evaluation of patient's response to treatment, examination of patient, obtaining history from patient or surrogate, ordering and performing treatments and interventions, ordering and review of laboratory studies, ordering and review of radiographic studies, pulse oximetry, re-evaluation of patient's condition and review of old charts     ____________________________________________   INITIAL IMPRESSION / MDM / ASSESSMENT AND  PLAN / ED COURSE  As part of my medical decision making, I reviewed the following data within the electronic MEDICAL RECORD NUMBER   23 year old female presenting with above-stated history and physical exam concerning for possible ectopic pregnancy, ovarian cysts, PID.  Patient's urine pregnancy test positive hCG quantitative 5689, white blood cell count 18.7.  Patient was taken ultrasound expeditiously where possible left ectopic pregnancy was identified.  Patient discussed with Dr. Dalbert Garnet OB/GYN on-call who came to the emergency department promptly and evaluated and admitted the patient for definitive management.          ____________________________________________  FINAL CLINICAL IMPRESSION(S) / ED DIAGNOSES  Final diagnoses:  Ruptured left tubal ectopic pregnancy causing hemoperitoneum     MEDICATIONS GIVEN DURING THIS VISIT:  Medications - No data to display   ED Discharge Orders    None       Note:  This document was prepared using Dragon voice recognition software and may include unintentional dictation errors.   Darci Current, MD 05/10/18 8309    Darci Current,  MD 05/10/18 4401

## 2018-05-10 NOTE — ED Notes (Addendum)
ED Provider at bedside.  Pt deaf but reads lips - declined interpreter  C/O diffuse pain in all abdominal quads, some N with V x 3 earlier last night and this am, pt has 51month old child at home, unsure of LMP, pain worse with palpation, pt denies dysuria or frequency, reports red and yellow discharge, denies hx of STI

## 2018-05-10 NOTE — Anesthesia Post-op Follow-up Note (Signed)
Anesthesia QCDR form completed.        

## 2018-05-10 NOTE — Op Note (Signed)
Abigail Curry PROCEDURE DATE: 05/10/2018  PREOPERATIVE DIAGNOSIS: Ruptured ectopic pregnancy POSTOPERATIVE DIAGNOSIS: Ruptured left fallopian tube ectopic pregnancy PROCEDURE: Evacuation of hemoperitoneum,  removal of ectopic pregnancy, lysis of adhesions  SURGEON:  Dr. Christeen Douglas ANESTHESIOLOGIST: Berdine Addison, MD Anesthesiologist: Berdine Addison, MD CRNA: Clovis Fredrickson, CRNA  INDICATIONS: 23 y.o. G2P1001 at Unknown gestational age here with concerns for ruptured ectopic pregnancy.  Please refer to preoperative notes for more details. Patient was counseled regarding need for laparoscopic salpingectomy. Risks of surgery including bleeding which may require transfusion or reoperation, infection, injury to bowel or other surrounding organs, need for additional procedures including laparotomy and other postoperative/anesthesia complications were explained to patient.  Written informed consent was obtained.  FINDINGS:  amount of hemoperitoneum.  Ectopic pregnancy found in the posterior cul-de-sac, dangling off of the fimbriated of the left fallopian tube.  No damage to the left fallopian tube, or the left ovary.  No evidence of active bleeding from the tube or the ovary.  Small normal appearing uterus, normal right fallopian tube, right ovary and left ovary.   Normal upper abdomen.  Appendix appears normal, but it is scarred down over the pelvic brim in 2 the right pelvic sidewall.  This was taken down about halfway, with lysis of adhesions at that point.  There were also adhesions to the right lower quadrant peritoneum from the bowels, which were taken down.  ANESTHESIA: General INTRAVENOUS FLUIDS: 1000 ml ESTIMATED BLOOD LOSS: 10 ml HEMOPERITONEUM: URINE OUTPUT: 200 ml SPECIMENS: ectopic gestation COMPLICATIONS: None immediate  PROCEDURE IN DETAIL:  The patient was taken to the operating room where general anesthesia was administered and was found to be adequate.   She was placed in the dorsal lithotomy position, and was prepped and draped in a sterile manner.  Attention was turned to the abdomen where an umbilical incision was made with the scalpel.  The Optiview 5-mm trocar and sleeve were then advanced without difficulty. Survey of the entry site was noted to be without damage. The abdomen was then insufflated with carbon dioxide gas and adequate pneumoperitoneum was obtained.  A survey of the patient's pelvis and abdomen revealed the findings above.  A 11-mm port was placed on the left under direct visualization, and a 5-mm right lower quadrant port was then placed under direct visualization.  The Nezhat suction irrigator was then used to suction the hemoperitoneum and irrigate the pelvis.  A survey of the pelvis noted ureter peristalsis deep to the ovarian ligament and not in the surgical field. Attention was then turned to the left fallopian tube and ovary. Good hemostasis was noted.  The specimen was placed in an EndoCatch bag and removed from the abdomen intact.   The operative sites were reviewed under low pressure, and there was no bleeding from any of the pelvic organs.  A survey of the abdomen, including left and right lower and upper quadrants, was undertaken under low pressure, with no blood noted to be welling.  It appears that the ectopic pregnancy had extruded from the left fimbria, which were currently intact and hemostatic.  Therefore, the left fallopian tube was left intact.   The abdomen was desufflated, and all instruments were removed.  The fascial incision of the 10-mm site was reapproximated with a 0 Vicryl figure-of-eight stitch; and all skin incisions were closed with 4-0 Vicryl and Dermabond. The patient tolerated the procedure well.  All instruments, needles, and sponge counts were correct x 2. The patient was taken to  the recovery room in stable condition.   Repeat CBC was drawn under anesthesia, and returned reassuring.  Her hemoglobin  dropped from 13.6 at 4:30 in the morning, to 11.8 at 7:45 in the morning.  Patient will be monitored for stability in PACU, and discharged home today.  Cline Cools, MD, MPH

## 2018-05-10 NOTE — Consult Note (Addendum)
Consult History and Physical   SERVICE: Gynecology   Patient Name: Abigail Curry Patient MRN:   [id number]  CC: Acute onset pelvic pain  HPI: Abigail Curry is a 23 y.o. G2P1001 at approximately 2 weeks by LMP not on contraception who began having acute and worsening pelvic pain starting 8 hours ago. NSVD baby boy 8 months ago.  She is mildly tachycardic and normotensive. She is shivering with voluntary guarding and diffuse pelvic pain, rebound tenderness.  Ultrasound with 2cm ectopic in left adnexa, empty uterus. +complex free fluid in pelvis.  Beta hcg >5000 Blood type: pending in our system, O positive from prior pregnancy H/H 13.6/41.3  Review of Systems: positives in bold GEN:   fevers, chills, weight changes, appetite changes, fatigue, night sweats HEENT:  HA, vision changes, hearing loss, congestion, rhinorrhea, sinus pressure, dysphagia CV:   CP, palpitations PULM:  SOB, cough GI:  abd pain, N/V/D/C GU:  dysuria, urgency, frequency MSK:  arthralgias, myalgias, back pain, swelling SKIN:  rashes, color changes, pallor NEURO:  numbness, weakness, tingling, seizures, dizziness, tremors PSYCH:  depression, anxiety, behavioral problems, confusion  HEME/LYMPH:  easy bruising or bleeding ENDO:  heat/cold intolerance  Past Obstetrical History: OB History    Gravida  1   Para      Term      Preterm      AB      Living        SAB      TAB      Ectopic      Multiple      Live Births              Past Gynecologic History: Patient's last menstrual period was 04/26/2018. Menstrual frequency Q 4 wks   Past Medical History: No past medical history on file.  Past Surgical History:   Past Surgical History:  Procedure Laterality Date  . COCHLEAR IMPLANT      Family History:  family history is not on file.  Social History:  Social History   Socioeconomic History  . Marital status: Single    Spouse name: Not on file  . Number of  children: Not on file  . Years of education: Not on file  . Highest education level: Not on file  Occupational History  . Not on file  Social Needs  . Financial resource strain: Not on file  . Food insecurity:    Worry: Not on file    Inability: Not on file  . Transportation needs:    Medical: Not on file    Non-medical: Not on file  Tobacco Use  . Smoking status: Never Smoker  . Smokeless tobacco: Never Used  Substance and Sexual Activity  . Alcohol use: Yes  . Drug use: Not on file  . Sexual activity: Not on file  Lifestyle  . Physical activity:    Days per week: Not on file    Minutes per session: Not on file  . Stress: Not on file  Relationships  . Social connections:    Talks on phone: Not on file    Gets together: Not on file    Attends religious service: Not on file    Active member of club or organization: Not on file    Attends meetings of clubs or organizations: Not on file    Relationship status: Not on file  . Intimate partner violence:    Fear of current or ex partner: Not on file  Emotionally abused: Not on file    Physically abused: Not on file    Forced sexual activity: Not on file  Other Topics Concern  . Not on file  Social History Narrative  . Not on file    Home Medications:  Medications reconciled in EPIC  No current facility-administered medications on file prior to encounter.    No current outpatient medications on file prior to encounter.    Allergies:  No Known Allergies  Physical Exam:  Temp:  [98.5 F (36.9 C)] 98.5 F (36.9 C) (03/21 0421) Pulse Rate:  [83-104] 83 (03/21 0626) Resp:  [16] 16 (03/21 0626) BP: (116)/(76-77) 116/77 (03/21 0626) SpO2:  [98 %-100 %] 98 % (03/21 0626) Weight:  [81.2 kg] 81.2 kg (03/21 0422)   General Appearance:  Well developed, well nourished, no acute distress, alert and oriented x3 HEENT:  Normocephalic atraumatic, extraocular movements intact, moist mucous membranes Cardiovascular:  Normal  S1/S2, regular rhythm, mild tachycardia, no murmurs Pulmonary:  clear to auscultation, no wheezes, rales or rhonchi, symmetric air entry, good air exchange Abdomen:  Bowel sounds present, soft, diffusely tender, nondistended,  Extremities:  Full range of motion, no pedal edema, 2+ distal pulses, no tenderness Skin:  normal coloration and turgor, no rashes, no suspicious skin lesions noted  Neurologic:  Cranial nerves 2-12 grossly intact, normal muscle tone, strength 5/5 all four extremities Psychiatric:  Normal mood and affect, appropriate, no AH/VH Pelvic:  deferred   Labs/Studies:   CBC and Coags:  Lab Results  Component Value Date   WBC 18.7 (H) 05/10/2018   NEUTOPHILPCT 87 05/10/2018   EOSPCT 0 05/10/2018   BASOPCT 0 05/10/2018   LYMPHOPCT 8 05/10/2018   HGB 13.6 05/10/2018   HCT 41.3 05/10/2018   MCV 89.0 05/10/2018   PLT 351 05/10/2018   CMP:  Lab Results  Component Value Date   NA 138 05/10/2018   K 3.5 05/10/2018   CL 106 05/10/2018   CO2 22 05/10/2018   BUN 9 05/10/2018   CREATININE 0.51 05/10/2018   CREATININE 0.47 01/28/2017   CREATININE 0.43 (L) 06/21/2016   PROT 6.5 05/10/2018   BILITOT 0.8 05/10/2018   ALT 13 05/10/2018   AST 14 (L) 05/10/2018   ALKPHOS 52 05/10/2018   Other Labs:  Other Imaging: US Ob Comp Less 14 Wks  Result Date: 05/10/2018 CLINICAL DATA:  23 year old female with history of pelvic pain and vaginal bleeding. Positive pregnancy test. EXAM: OBSTETRIC <14 WK [place] AND TRANSVAGINAL OB US TECHNIQUE: Both transabdominal and transvaginal ultrasound examinations were performed for complete evaluation of the gestation as well as the maternal uterus, adnexal regions, and pelvic cul-de-sac. Transvaginal technique was performed to assess early pregnancy. COMPARISON:  No priors. FINDINGS: Intrauterine gestational sac: None. Yolk sac:  None. Embryo:  None. Cardiac Activity: None. Heart Rate: N/A Maternal uterus/adnexae: Uterus and ovaries are normal in  appearance. However, adjacent to the left ovary in the left adnexal region there is a 1.9 x 2.0 x 2.1 cm lesion that is peripherally of intermediate to high echogenicity, centrally hypoechoic and demonstrates some posterior acoustic enhancement, concerning for ectopic pregnancy. Moderate volume of free fluid in the cul-de-sac. IMPRESSION: 1. No IUP identified. 2. Findings are highly concerning for potential ectopic pregnancy in the left adnexal region, as above. Critical Value/emergent results were called by telephone at the time of interpretation on 05/10/2018 at 6:55 am to Dr. Bayard Males, who verbally acknowledged these results. Electronically Signed   By: Brayton Mars.D.  On: 05/10/2018 06:57   US Ob Transvaginal  Result Date: 05/10/2018 CLINICAL DATA:  23 year old female with history of pelvic pain and vaginal bleeding. Positive pregnancy test. EXAM: OBSTETRIC <14 WK [place] AND TRANSVAGINAL OB US TECHNIQUE: Both transabdominal and transvaginal ultrasound examinations were performed for complete evaluation of the gestation as well as the maternal uterus, adnexal regions, and pelvic cul-de-sac. Transvaginal technique was performed to assess early pregnancy. COMPARISON:  No priors. FINDINGS: Intrauterine gestational sac: None. Yolk sac:  None. Embryo:  None. Cardiac Activity: None. Heart Rate: N/A Maternal uterus/adnexae: Uterus and ovaries are normal in appearance. However, adjacent to the left ovary in the left adnexal region there is a 1.9 x 2.0 x 2.1 cm lesion that is peripherally of intermediate to high echogenicity, centrally hypoechoic and demonstrates some posterior acoustic enhancement, concerning for ectopic pregnancy. Moderate volume of free fluid in the cul-de-sac. IMPRESSION: 1. No IUP identified. 2. Findings are highly concerning for potential ectopic pregnancy in the left adnexal region, as above. Critical Value/emergent results were called by telephone at the time of interpretation on  05/10/2018 at 6:55 am to Dr. Bayard Males, who verbally acknowledged these results. Electronically Signed   By: Trudie Reed M.D.   On: 05/10/2018 06:57     Assessment / Plan:   Abigail Curry is a 23 y.o. G2P1001 who presents with suspicion of ruptured ectopic pregnancy  Ectopic pregnancy: Causes discussed with patient, including prevalence, common causes, and outcomes.  Consents signed today. Risks of dx lap with salpingectomy and evacuation of hemoperitoneum surgery were discussed with the patient including but not limited to: bleeding which may require transfusion; infection which may require antibiotics; injury to uterus or surrounding organs; intrauterine scarring which may impair future fertility if D&C is required; need for additional procedures including laparotomy or laparoscopy; and other postoperative/anesthesia complications. Written informed consent was obtained.  She is interested in progesterone IUD for contraception and asks that we place it now if possible. However, we have no surplus of IUDs and none stored from the prior immediate postpartum LARC grant. Therefore I will order and try to place in the office within a week.  This is a scheduled same-day surgery. She will have a postop visit in 2 weeks to review operative findings and pathology.

## 2018-05-10 NOTE — ED Notes (Signed)
Family at bedside. 

## 2018-05-10 NOTE — ED Triage Notes (Signed)
Pt with pelvic pain and vaginal bleeding with associated vomiting that began this pm. Pt states is uncomfortable to sit on buttocks. Pt has not used any pads to control bleeding. Pt is hearing impaired and reads lips.

## 2018-05-10 NOTE — Transfer of Care (Signed)
Immediate Anesthesia Transfer of Care Note  Patient: Abigail Curry  Procedure(s) Performed: DIAGNOSTIC LAPAROSCOPY WITH REMOVAL OF ECTOPIC PREGNANCY (Left )  Patient Location: PACU  Anesthesia Type:General  Level of Consciousness: sedated  Airway & Oxygen Therapy: Patient Spontanous Breathing and Patient connected to nasal cannula oxygen  Post-op Assessment: Report given to RN and Post -op Vital signs reviewed and stable  Post vital signs: Reviewed and stable  Last Vitals:  Vitals Value Taken Time  BP 117/75 05/10/2018  8:56 AM  Temp 36.8 C 05/10/2018  8:56 AM  Pulse 77 05/10/2018  8:58 AM  Resp 15 05/10/2018  8:58 AM  SpO2 100 % 05/10/2018  8:58 AM  Vitals shown include unvalidated device data.  Last Pain:  Vitals:   05/10/18 0856  TempSrc:   PainSc: Asleep         Complications: No apparent anesthesia complications

## 2018-05-10 NOTE — Anesthesia Procedure Notes (Signed)
Procedure Name: Intubation Date/Time: 05/10/2018 7:39 AM Performed by: Clinton Sawyer, CRNA Pre-anesthesia Checklist: Patient identified, Emergency Drugs available, Suction available, Patient being monitored and Timeout performed Patient Re-evaluated:Patient Re-evaluated prior to induction Oxygen Delivery Method: Circle system utilized Preoxygenation: Pre-oxygenation with 100% oxygen Induction Type: IV induction and Rapid sequence Laryngoscope Size: Mac and 4 Grade View: Grade I Tube type: Oral Tube size: 7.0 mm Number of attempts: 1 Placement Confirmation: ETT inserted through vocal cords under direct vision,  positive ETCO2 and breath sounds checked- equal and bilateral Secured at: 22 cm Tube secured with: Tape Dental Injury: Teeth and Oropharynx as per pre-operative assessment

## 2018-05-10 NOTE — Discharge Instructions (Signed)
General Anesthesia, Adult, Care After °This sheet gives you information about how to care for yourself after your procedure. Your health care provider may also give you more specific instructions. If you have problems or questions, contact your health care provider. °What can I expect after the procedure? °After the procedure, the following side effects are common: °· Pain or discomfort at the IV site. °· Nausea. °· Vomiting. °· Sore throat. °· Trouble concentrating. °· Feeling cold or chills. °· Weak or tired. °· Sleepiness and fatigue. °· Soreness and body aches. These side effects can affect parts of the body that were not involved in surgery. °Follow these instructions at home: ° °For at least 24 hours after the procedure: °· Have a responsible adult stay with you. It is important to have someone help care for you until you are awake and alert. °· Rest as needed. °· Do not: °? Participate in activities in which you could fall or become injured. °? Drive. °? Use heavy machinery. °? Drink alcohol. °? Take sleeping pills or medicines that cause drowsiness. °? Make important decisions or sign legal documents. °? Take care of children on your own. °Eating and drinking °· Follow any instructions from your health care provider about eating or drinking restrictions. °· When you feel hungry, start by eating small amounts of foods that are soft and easy to digest (bland), such as toast. Gradually return to your regular diet. °· Drink enough fluid to keep your urine pale yellow. °· If you vomit, rehydrate by drinking water, juice, or clear broth. °General instructions °· If you have sleep apnea, surgery and certain medicines can increase your risk for breathing problems. Follow instructions from your health care provider about wearing your sleep device: °? Anytime you are sleeping, including during daytime naps. °? While taking prescription pain medicines, sleeping medicines, or medicines that make you drowsy. °· Return to  your normal activities as told by your health care provider. Ask your health care provider what activities are safe for you. °· Take over-the-counter and prescription medicines only as told by your health care provider. °· If you smoke, do not smoke without supervision. °· Keep all follow-up visits as told by your health care provider. This is important. °Contact a health care provider if: °· You have nausea or vomiting that does not get better with medicine. °· You cannot eat or drink without vomiting. °· You have pain that does not get better with medicine. °· You are unable to pass urine. °· You develop a skin rash. °· You have a fever. °· You have redness around your IV site that gets worse. °Get help right away if: °· You have difficulty breathing. °· You have chest pain. °· You have blood in your urine or stool, or you vomit blood. °Summary °· After the procedure, it is common to have a sore throat or nausea. It is also common to feel tired. °· Have a responsible adult stay with you for the first 24 hours after general anesthesia. It is important to have someone help care for you until you are awake and alert. °· When you feel hungry, start by eating small amounts of foods that are soft and easy to digest (bland), such as toast. Gradually return to your regular diet. °· Drink enough fluid to keep your urine pale yellow. °· Return to your normal activities as told by your health care provider. Ask your health care provider what activities are safe for you. °This information is not   intended to replace advice given to you by your health care provider. Make sure you discuss any questions you have with your health care provider. Document Released: 05/14/2000 Document Revised: 09/21/2016 Document Reviewed: 09/21/2016 Elsevier Interactive Patient Education  2019 [employer]               Laparoscopic Removal for Ectopic Pregnancy Discharge Instructions  Laparoscopic removal is a procedure that  removes the ectopic pregnancy.  For the next three days, take ibuprofen and acetaminophen on a schedule, every 8 hours. You can take them together or you can intersperse them, and take one every four hours. I also gave you gabapentin for nighttime, to help you sleep and also to control pain. Take gabapentin medicines at night for at least the next 3 nights. You also have a narcotic, oxycodone, to take as needed if the above medicines don't help.  Postop constipation is a major cause of pain. Stay well hydrated, walk as you tolerate, and take over the counter senna as well as stool softeners if you need them.  RISKS AND COMPLICATIONS   Infection.  Bleeding.  Injury to surrounding organs.  Anesthetic side effects.  Failure of the procedure.  Risks of future ectopic pregnancy on the other side PROCEDURE   You may be given a medicine to help you relax (sedative) before the procedure. You will be given a medicine to make you sleep (general anesthetic) during the procedure.  A tube will be put down your throat to help your breath while under general anesthesia.  Two small cuts (incisions) are made in the lower abdominal area and one incision is made near the belly button.  Your abdominal area will be inflated with a safe gas (carbon dioxide). This helps give the surgeon room to operate, visualize, and helps the surgeon avoid other organs.  A thin, lighted tube (laparoscope) with a camera attached is inserted into your abdomen through the incision near the belly button. Other small instruments are also inserted through the other abdominal incisions.  The fallopian tube is located and are removed.  After the fallopian tube is removed, the gas is released from the abdomen.  The incisions will be closed with stitches (sutures), and Dermabond. A bandage may be placed over the incisions. AFTER THE PROCEDURE   You will also have some mild abdominal discomfort for 3-7 days. You will be given  pain medicine to ease any discomfort.  As long as there are no problems, you may be allowed to go home. Someone will need to drive you home and be with you for at least 24 hours once home.  You may have some mild discomfort in the throat. This is from the tube placed in your throat while you were sleeping.  You may experience discomfort in the shoulder area from some trapped air between the liver and diaphragm. This sensation is normal and will slowly go away on its own. HOME CARE INSTRUCTIONS   Take all medicines as directed.  Only take over-the-counter or prescription medicines for pain, discomfort, or fever as directed by your caregiver.  Resume daily activities as directed.  Showers are preferred over baths.  You may resume sexual activities in 1 week or as directed.  Do not drive while taking narcotics. SEEK MEDICAL CARE IF: .  There is increasing abdominal pain.  You feel lightheaded or faint.  You have the chills.  You have an oral temperature above 102 F (38.9 C).  There is pus-like (purulent) drainage from  any of the wounds.  You are unable to pass gas or have a bowel movement.  You feel sick to your stomach (nauseous) or throw up (vomit). MAKE SURE YOU:   Understand these instructions.  Will watch your condition.  Will get help right away if you are not doing well or get worse.  ExitCare Patient Information 2013 [place], [place].

## 2018-05-10 NOTE — ED Notes (Signed)
ED Provider at bedside. 

## 2018-05-10 NOTE — Anesthesia Postprocedure Evaluation (Signed)
Anesthesia Post Note  Patient: Abigail Curry  Procedure(s) Performed: DIAGNOSTIC LAPAROSCOPY WITH REMOVAL OF ECTOPIC PREGNANCY (Left )  Patient location during evaluation: PACU Anesthesia Type: General Level of consciousness: awake and alert Pain management: pain level controlled Vital Signs Assessment: post-procedure vital signs reviewed and stable Respiratory status: spontaneous breathing, nonlabored ventilation, respiratory function stable and patient connected to nasal cannula oxygen Cardiovascular status: blood pressure returned to baseline and stable Postop Assessment: no apparent nausea or vomiting Anesthetic complications: no     Last Vitals:  Vitals:   05/10/18 0941 05/10/18 0952  BP: (!) 99/54 104/79  Pulse: 79 80  Resp: 17 19  Temp:    SpO2: 100% 100%    Last Pain:  Vitals:   05/10/18 0952  TempSrc:   PainSc: 3                  Anelle Parlow S

## 2018-05-10 NOTE — ED Notes (Signed)
Pt transported to PACU with family att

## 2018-05-10 NOTE — Anesthesia Preprocedure Evaluation (Signed)
Anesthesia Evaluation  Patient identified by MRN, date of birth, ID band Patient awake    Reviewed: Allergy & Precautions, NPO status , Patient's Chart, lab work & pertinent test results, reviewed documented beta blocker date and time   Airway Mallampati: II  TM Distance: >3 FB     Dental  (+) Chipped   Pulmonary           Cardiovascular      Neuro/Psych    GI/Hepatic   Endo/Other    Renal/GU      Musculoskeletal   Abdominal   Peds  Hematology   Anesthesia Other Findings Decreased hearing. 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  Reproductive/Obstetrics                             Anesthesia Physical Anesthesia Plan  ASA: II  Anesthesia Plan: General   Post-op Pain Management:    Induction: Intravenous  PONV Risk Score and Plan:   Airway Management Planned: Oral ETT  Additional Equipment:   Intra-op Plan:   Post-operative Plan:   Informed Consent: I have reviewed the patients History and Physical, chart, labs and discussed the procedure including the risks, benefits and alternatives for the proposed anesthesia with the patient or authorized representative who has indicated his/her understanding and acceptance.       Plan Discussed with: CRNA  Anesthesia Plan Comments:         Anesthesia Quick Evaluation

## 2018-05-11 ENCOUNTER — Encounter: Payer: Self-pay | Admitting: Obstetrics and Gynecology

## 2018-05-13 LAB — SURGICAL PATHOLOGY

## 2018-05-25 ENCOUNTER — Encounter: Payer: Self-pay | Admitting: Obstetrics and Gynecology

## 2018-10-16 ENCOUNTER — Other Ambulatory Visit: Payer: Self-pay

## 2018-10-16 ENCOUNTER — Emergency Department
Admission: EM | Admit: 2018-10-16 | Discharge: 2018-10-16 | Disposition: A | Discharge status: Home / Self Care | Payer: Medicaid Other | Attending: Student in an Organized Health Care Education/Training Program | Admitting: Student in an Organized Health Care Education/Training Program

## 2018-10-16 ENCOUNTER — Emergency Department: Payer: Medicaid Other

## 2018-10-16 DIAGNOSIS — Z79899 Other long term (current) drug therapy: Secondary | ICD-10-CM | POA: Insufficient documentation

## 2018-10-16 DIAGNOSIS — B9789 Other viral agents as the cause of diseases classified elsewhere: Secondary | ICD-10-CM | POA: Diagnosis not present

## 2018-10-16 DIAGNOSIS — J069 Acute upper respiratory infection, unspecified: Secondary | ICD-10-CM | POA: Insufficient documentation

## 2018-10-16 DIAGNOSIS — J029 Acute pharyngitis, unspecified: Secondary | ICD-10-CM

## 2018-10-16 DIAGNOSIS — R05 Cough: Secondary | ICD-10-CM | POA: Diagnosis present

## 2018-10-16 DIAGNOSIS — Z20822 Contact with and (suspected) exposure to covid-19: Secondary | ICD-10-CM

## 2018-10-16 LAB — GROUP A STREP BY PCR: Group A Strep by PCR: NOT DETECTED

## 2018-10-16 MED ORDER — HYDROCOD POLST-CPM POLST ER 10-8 MG/5ML PO SUER
5.0000 mL | Freq: Once | ORAL | Status: AC
  Administered 2018-10-16: 5 mL via ORAL
  Filled 2018-10-16: qty 5

## 2018-10-16 MED ORDER — LIDOCAINE VISCOUS HCL 2 % MT SOLN: 5.0000 mL | Freq: Four times a day (QID) | OROMUCOSAL | 0 refills | Status: DC | PRN

## 2018-10-16 MED ORDER — IBUPROFEN 600 MG PO TABS: 600.0000 mg | ORAL_TABLET | Freq: Three times a day (TID) | ORAL | 0 refills | Status: DC | PRN

## 2018-10-16 MED ORDER — PSEUDOEPH-BROMPHEN-DM 30-2-10 MG/5ML PO SYRP: 5.0000 mL | ORAL_SOLUTION | Freq: Four times a day (QID) | ORAL | 0 refills | Status: DC | PRN

## 2018-10-16 NOTE — ED Triage Notes (Signed)
Pt c/o cough with SOB and sore throat for the past couple of days. Pt was tested today at the testing center but does not have results yet.

## 2018-10-16 NOTE — ED Provider Notes (Signed)
[facility] Emergency Department Provider Note   ____________________________________________   First MD Initiated Contact with Patient 10/16/18 [phone]     (approximate)  I have reviewed the triage vital signs and the nursing notes.   HISTORY  Chief Complaint URI History given by mother secondary to patient deafness.   HPI Abigail Curry is a 23 y.o. female patient complain of cough, shortness of breath, sore throat for few days.  Patient states she was tested for COVID 19 today but results would not be available for 2 to 3 days.  Patient requesting rapid cover testing.  Patient rates the pain as a 10/10.  Patient described the pain mostly"achy".  No palliative measure for complaint.      History reviewed. No pertinent past medical history.  There are no active problems to display for this patient.   Past Surgical History:  Procedure Laterality Date  . COCHLEAR IMPLANT    . DIAGNOSTIC LAPAROSCOPY WITH REMOVAL OF ECTOPIC PREGNANCY Left 05/10/2018   Procedure: DIAGNOSTIC LAPAROSCOPY WITH REMOVAL OF ECTOPIC PREGNANCY;  Surgeon: Christeen DouglasBeasley, Bethany, MD;  Location: [facility] ORS;  Service: Gynecology;  Laterality: Left;    Prior to Admission medications   Medication Sig Start Date End Date Taking? Authorizing Provider  brompheniramine-pseudoephedrine-DM 30-2-10 MG/5ML syrup Take 5 mLs by mouth 4 (four) times daily as needed. 10/16/18   Joni ReiningSmith, Ronald K, PA-C  docusate sodium (COLACE) 100 MG capsule Take 1 capsule (100 mg total) by mouth 2 (two) times daily. To keep stools soft 05/10/18   Christeen DouglasBeasley, Bethany, MD  ibuprofen (ADVIL) 600 MG tablet Take 1 tablet (600 mg total) by mouth every 8 (eight) hours as needed. 10/16/18   Joni ReiningSmith, Ronald K, PA-C  lidocaine (XYLOCAINE) 2 % solution Use as directed 5 mLs in the mouth or throat every 6 (six) hours as needed for mouth pain. Oral swish and swallow. 10/16/18   Joni ReiningSmith, Ronald K, PA-C  oxycodone (OXY-IR) 5 MG capsule Take 1  capsule (5 mg total) by mouth every 6 (six) hours as needed for pain. 05/10/18   Christeen DouglasBeasley, Bethany, MD    Allergies Patient has no known allergies.  No family history on file.  Social History Social History   Tobacco Use  . Smoking status: Never Smoker  . Smokeless tobacco: Never Used  Substance Use Topics  . Alcohol use: Yes  . Drug use: Not on file    Review of Systems Constitutional: No fever/chills. Eyes: No visual changes. ENT: Sore throat and nasal congestion. Cardiovascular: Denies chest pain. Respiratory:  shortness of breath and nonproductive cough. Gastrointestinal: No abdominal pain.  No nausea, no vomiting.  No diarrhea.  No constipation. Genitourinary: Negative for dysuria. Musculoskeletal: Negative for back pain. Skin: Negative for rash. Neurological: Negative for headaches, focal weakness or numbness.   ____________________________________________   PHYSICAL EXAM:  VITAL SIGNS: ED Triage Vitals  Enc Vitals Group     BP 10/16/18 0936 114/75     Pulse Rate 10/16/18 0936 86     Resp 10/16/18 0936 17     Temp 10/16/18 0936 98.9 F (37.2 C)     Temp Source 10/16/18 0936 Oral     SpO2 10/16/18 0936 96 %     Weight 10/16/18 0935 161 lb (73 kg)     Height 10/16/18 0935 5\' 7"  (1.702 m)     Head Circumference --      Peak Flow --      Pain Score 10/16/18 0935 10  Pain Loc --      Pain Edu? --      Excl. in [place]? --    Constitutional: Alert and oriented. Well appearing and in no acute distress. Nose: Bilateral maxillary guarding with edematous nasal turbinates.  Clear rhinorrhea.   Mouth/Throat: Mucous membranes are moist.  Oropharynx non-erythematous.  Postnasal drainage. Neck: No stridor.  Hematological/Lymphatic/Immunilogical: No cervical lymphadenopathy. Cardiovascular: Normal rate, regular rhythm. Grossly normal heart sounds.  Good peripheral circulation. Respiratory: Normal respiratory effort.  No retractions. Lungs CTAB. Gastrointestinal: Soft  and nontender. No distention. No abdominal bruits. No CVA tenderness. Neurologic:  Normal speech and language. No gross focal neurologic deficits are appreciated. No gait instability. Skin:  Skin is warm, dry and intact. No rash noted. Psychiatric: Mood and affect are normal. Speech and behavior are normal.  ____________________________________________   LABS (all labs ordered are listed, but only abnormal results are displayed)  Labs Reviewed  GROUP A STREP BY PCR   ____________________________________________  EKG   ____________________________________________  RADIOLOGY  ED MD interpretation:    Official radiology report(s): Dg Chest Portable 1 View  Result Date: 10/16/2018 CLINICAL DATA:  Cough. EXAM: PORTABLE CHEST 1 VIEW COMPARISON:  Radiographs of December 29, 2011. FINDINGS: The heart size and mediastinal contours are within normal limits. Both lungs are clear. The visualized skeletal structures are unremarkable. IMPRESSION: No active disease. Electronically Signed   By: Marijo Conception M.D.   On: 10/16/2018 10:12    ____________________________________________   PROCEDURES  Procedure(s) performed (including Critical Care):  Procedures   ____________________________________________   INITIAL IMPRESSION / ASSESSMENT AND PLAN / ED COURSE  As part of my medical decision making, I reviewed the following data within the [place] was evaluated in Emergency Department on 10/16/2018 for the symptoms described in the history of present illness. She was evaluated in the context of the global COVID-19 pandemic, which necessitated consideration that the patient might be at risk for infection with the SARS-CoV-2 virus that causes COVID-19. Institutional protocols and algorithms that pertain to the evaluation of patients at risk for COVID-19 are in a state of rapid change based on information released by regulatory bodies including the  CDC and federal and state organizations. These policies and algorithms were followed during the patient's care in the ED.  Patient presents URI signs symptoms consist of nasal congestion cough, and shortness of breath.  Patient had a COVID-19 test early before arrival.  Results are pending.  Patient physical exam is consistent with viral respiratory infection and pharyngitis.  Discussed negative chest x-ray and strep test results with patient.  Patient given discharge care instructions and advised self quarantine pending results of COVID-19 test.          ____________________________________________   FINAL CLINICAL IMPRESSION(S) / ED DIAGNOSES  Final diagnoses:  Viral URI with cough  Sore throat     ED Discharge Orders         Ordered    brompheniramine-pseudoephedrine-DM 30-2-10 MG/5ML syrup  4 times daily PRN     10/16/18 1102    ibuprofen (ADVIL) 600 MG tablet  Every 8 hours PRN     10/16/18 1102    lidocaine (XYLOCAINE) 2 % solution  Every 6 hours PRN     10/16/18 1102           Note:  This document was prepared using Dragon voice recognition software and may include unintentional dictation errors.    Tamala Julian,  Arther Abbott, PA-C 10/16/18 1109    Willy Eddy, MD 10/16/18 [phone]

## 2018-10-17 LAB — NOVEL CORONAVIRUS, NAA: SARS-CoV-2, NAA: NOT DETECTED

## 2019-02-20 NOTE — L&D Delivery Note (Addendum)
Delivery Summary for Abigail Curry  Labor Events:   Preterm labor: No data found  Rupture date: No data found  Rupture time: No data found  Rupture type: No data found  Fluid Color: No data found  Induction: No data found  Augmentation: No data found  Complications: No data found  Cervical ripening: No data found No data found   No data found     Delivery:   Episiotomy: No data found  Lacerations: No data found  Repair suture: No data found  Repair # of packets: No data found  Blood loss (ml): 425   Information for the patient's newborn:  Zilla, Shartzer [[id number]]    Delivery 08/21/2019 3:49 PM by  C-Section, Low Transverse Sex:  female Gestational Age: [redacted]w[redacted]d Delivery Clinician:   Living?:         APGARS  One minute Five minutes Ten minutes  Skin color:        Heart rate:        Grimace:        Muscle tone:        Breathing:        Totals: 5  8      Presentation/position:      Resuscitation:   Cord information:    Disposition of cord blood:     Blood gases sent?  Complications:   Placenta: Delivered:       appearance Newborn Measurements: Weight: 3 lb 7.4 oz (1570 g)  Height: 17.13"  Head circumference:    Chest circumference:    Other providers:    Additional  information: Forceps:   Vacuum:   Breech:   Observed anomalies preterm SGA        Please see Dr. Oretha Milch operative note for details of C-section procedure.    Hildred Laser, MD [facility]

## 2019-02-20 NOTE — L&D Delivery Note (Signed)
1243 Notified Dr Valentino Saxon of Category II FHR tracing. Verbal orders given to RNs to start IV, give fluid bolus and obtain labs.   1304 Bedside ultrasound ordered and unofficial results reviewed from tech.   1407 Telephone call from [facility] with ultrasound results.   1447 Update given to Dr. Valentino Saxon. Persistent Category II tracing, will proceed with surgical birth.    Serafina Royals, CNM [facility], [facility] 08/21/19 5:32 PM

## 2019-03-13 ENCOUNTER — Other Ambulatory Visit: Payer: Self-pay

## 2019-03-13 ENCOUNTER — Encounter: Payer: Self-pay | Admitting: Physician Assistant

## 2019-03-13 ENCOUNTER — Ambulatory Visit: Payer: Medicaid Other | Admitting: Physician Assistant

## 2019-03-13 DIAGNOSIS — A5901 Trichomonal vulvovaginitis: Secondary | ICD-10-CM | POA: Diagnosis not present

## 2019-03-13 DIAGNOSIS — Z113 Encounter for screening for infections with a predominantly sexual mode of transmission: Secondary | ICD-10-CM | POA: Diagnosis not present

## 2019-03-13 LAB — WET PREP FOR TRICH, YEAST, CLUE
Trichomonas Exam: POSITIVE — AB
Yeast Exam: NEGATIVE

## 2019-03-13 MED ORDER — METRONIDAZOLE 500 MG PO TABS: 2000.0000 mg | ORAL_TABLET | Freq: Once | ORAL | 0 refills | Status: AC

## 2019-03-13 NOTE — Progress Notes (Signed)
Pt is here for STD screening. Pt reports she is currently pregnant.Lyman Speller, RN

## 2019-03-14 NOTE — Progress Notes (Signed)
[facility] Department STI clinic/screening visit  Subjective:  Abigail Curry is a 24 y.o. female being seen today for an STI screening visit. The patient reports they do have symptoms.  Patient reports that they are currently 3 months pregnant.   They reported they are not interested in discussing contraception today.  Patient's last menstrual period was 09/26/2018 (approximate).   Patient has the following medical conditions:   Patient Active Problem List   Diagnosis Date Noted  . 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 SEXUALLY TRANSMITTED DISEASE    here for a check-up    HPI  Patient reports that she has had a white discharge and itching for about 1 week.  States that she would like a screening today.  Denies other symptoms.  See flowsheet for further details and programmatic requirements.    The following portions of the patient's history were reviewed and updated as appropriate: allergies, current medications, past medical history, past social history, past surgical history and problem list.  Objective:  There were no vitals filed for this visit.  Physical Exam Constitutional:      General: She is not in acute distress.    Appearance: Normal appearance. She is normal weight.  HENT:     Head: Normocephalic and atraumatic.     Comments: No nits, lice, or hair loss. No cervical, supraclavicular or axillary adenopathy.    Mouth/Throat:     Mouth: Mucous membranes are moist.     Pharynx: Oropharynx is clear. No oropharyngeal exudate or posterior oropharyngeal erythema.  Eyes:     Conjunctiva/sclera: Conjunctivae normal.  Pulmonary:     Effort: Pulmonary effort is normal.  Abdominal:     Palpations: Abdomen is soft. There is no mass.     Tenderness: There is no abdominal tenderness. There is no guarding or rebound.  Genitourinary:    General: Normal vulva.     Rectum: Normal.     Comments:  External genitalia/pubic area without nits, lice, edema, erythema, lesions and inguinal adenopathy. Vagina with normal mucosa and small amount of grayish, frothy, discharge, pH=>4.5. Cervix without visible lesions. Uterus firm, mobile, nt, no masses, no CMT, no adnexal tenderness or fullness, upper limits of normal size. Musculoskeletal:     Cervical back: Neck supple. No tenderness.  Skin:    General: Skin is warm and dry.     Findings: No bruising, erythema, lesion or rash.  Neurological:     Mental Status: She is alert and oriented to person, place, and time.  Psychiatric:        Mood and Affect: Mood normal.        Behavior: Behavior normal.        Thought Content: Thought content normal.        Judgment: Judgment normal.      Assessment and Plan:  Marnell TORIA MONTE is a 24 y.o. female presenting to the [facility] Department for STI screening  1. Screening for STD (sexually transmitted disease) Patient into clinic with symptoms. Rec condoms with all sex. Await test results.  Counseled that RN will call if needs to RTC for further treatment once results are back. Enc patient to follow up with OB per recs. - WET PREP FOR TRICH, YEAST, CLUE - HIV [facility] - Syphilis Serology, [facility] - Chlamydia/Gonorrhea [facility]  2. Trichomonal vaginitis Treat for Trich with Metronidazole 2 g po at one time  with food, no EtOH for 24 hr before and until 72 hr after taking medicine and after delivery. No sex for 7 days and until after partner completes treatment. RTC if vomits < 2 hr after taking medicine for re-treatment. - metroNIDAZOLE (FLAGYL) 500 MG tablet; Take 4 tablets (2,000 mg total) by mouth once for 1 dose.  Dispense: 4 tablet; Refill: 0     No follow-ups on file.  No future appointments.  Jerene Dilling, PA

## 2019-03-19 ENCOUNTER — Telehealth: Payer: Self-pay

## 2019-03-23 NOTE — Telephone Encounter (Signed)
Lab sent for scanning. Kaylin Marcon, RN  

## 2019-03-23 NOTE — Telephone Encounter (Signed)
TC from mother of patient.  States she is calling for patient because she is hearing impaired. Verified ID via password.  Informed mom that patient needs tx.  Appt scheduled and instructed mom to tell patient to eat before appt.  Also informed mom that RN could not review exact results; RN will review everything with patient tomorrow at visit. Richmond Campbell, RN    Needs chlamydia tx

## 2019-03-24 ENCOUNTER — Other Ambulatory Visit: Payer: Self-pay

## 2019-03-24 ENCOUNTER — Ambulatory Visit: Payer: Self-pay

## 2019-03-24 DIAGNOSIS — A749 Chlamydial infection, unspecified: Secondary | ICD-10-CM

## 2019-03-24 MED ORDER — AZITHROMYCIN 500 MG PO TABS
1000.0000 mg | ORAL_TABLET | Freq: Once | ORAL | Status: AC
  Administered 2019-03-24: 17:00:00 1000 mg via ORAL

## 2019-03-24 NOTE — Progress Notes (Signed)
Client hearing impaired and requested RN write most of conversation. RN able to understand client's verbal responses. Instructions regarding chlamydia treatment / recurrence prevention written down and provided to client. Client pregnant and unsure of EDD as not yet established PNC. Aware needs TOC in 3 weeks as pregnant with test for re-infection in 3 months. Client states plans to schedule prenatal care appt soon. Jossie Ng, RN

## 2019-04-17 ENCOUNTER — Encounter: Payer: Self-pay | Admitting: Certified Nurse Midwife

## 2019-04-17 ENCOUNTER — Other Ambulatory Visit (HOSPITAL_COMMUNITY)
Admission: RE | Admit: 2019-04-17 | Discharge: 2019-04-17 | Disposition: A | Discharge status: Home / Self Care | Payer: Medicaid Other | Source: Ambulatory Visit | Attending: Certified Nurse Midwife | Admitting: Certified Nurse Midwife

## 2019-04-17 ENCOUNTER — Ambulatory Visit (INDEPENDENT_AMBULATORY_CARE_PROVIDER_SITE_OTHER): Payer: Medicaid Other | Admitting: Certified Nurse Midwife

## 2019-04-17 VITALS — BP 92/59 | HR 93 | Ht 68.0 in | Wt 162.1 lb

## 2019-04-17 DIAGNOSIS — Z8619 Personal history of other infectious and parasitic diseases: Secondary | ICD-10-CM | POA: Diagnosis not present

## 2019-04-17 DIAGNOSIS — N912 Amenorrhea, unspecified: Secondary | ICD-10-CM | POA: Insufficient documentation

## 2019-04-17 LAB — POCT URINE PREGNANCY: Preg Test, Ur: POSITIVE — AB

## 2019-04-17 NOTE — Progress Notes (Signed)
Patient presents for pregnancy confirmation.  No complaints.

## 2019-04-17 NOTE — Progress Notes (Signed)
GYN ENCOUNTER NOTE  Subjective:       Abigail Curry is a 24 y.o. G92P1011 female here for pregnancy confirmation.   Denies difficulty breathing or respiratory distress, chest pain, abdominal pain, vaginal bleeding, dysuria, and leg pain or swelling.   Pregnancy unplanned. Patient is hearing impaired. Due to mask policy questions were asked and answered through a combination of speech and writing.    Gynecologic History  Patient's last menstrual period was 02/19/2019 (approximate).  Gestational age: 71 weeks 1 day  Estimated date of birth: 11/26/2020  Obstetric History  OB History  Gravida Para Term Preterm AB Living  3 1 1   1 1   SAB TAB Ectopic Multiple Live Births      1   1    # Outcome Date GA Lbr Len/2nd Weight Sex Delivery Anes PTL Lv  3 Current           2 Term 09/05/17    M Vag-Spont EPI N LIV  1 Ectopic             Past Surgical History:  Procedure Laterality Date  . COCHLEAR IMPLANT    . DIAGNOSTIC LAPAROSCOPY WITH REMOVAL OF ECTOPIC PREGNANCY Left 05/10/2018   Procedure: DIAGNOSTIC LAPAROSCOPY WITH REMOVAL OF ECTOPIC PREGNANCY;  Surgeon: Benjaman Kindler, MD;  Location: [facility] ORS;  Service: Gynecology;  Laterality: Left;    No Known Allergies  Social History   Socioeconomic History  . Marital status: Single    Spouse name: Not on file  . Number of children: Not on file  . Years of education: Not on file  . Highest education level: Not on file  Occupational History  . Not on file  Tobacco Use  . Smoking status: Never Smoker  . Smokeless tobacco: Never Used  Substance and Sexual Activity  . Alcohol use: Not Currently  . Drug use: Not Currently  . Sexual activity: Not Currently    Birth control/protection: None  Other Topics Concern  . Not on file  Social History Narrative  . Not on file   Social Determinants of Health   Financial Resource Strain:   . Difficulty of Paying Living Expenses: Not on file  Food Insecurity:   . Worried About  Charity fundraiser in the Last Year: Not on file  . Ran Out of Food in the Last Year: Not on file  Transportation Needs:   . Lack of Transportation (Medical): Not on file  . Lack of Transportation (Non-Medical): Not on file  Physical Activity:   . Days of Exercise per Week: Not on file  . Minutes of Exercise per Session: Not on file  Stress:   . Feeling of Stress : Not on file  Social Connections:   . Frequency of Communication with Friends and Family: Not on file  . Frequency of Social Gatherings with Friends and Family: Not on file  . Attends Religious Services: Not on file  . Active Member of Clubs or Organizations: Not on file  . Attends Archivist Meetings: Not on file  . Marital Status: Not on file  Intimate Partner Violence:   . Fear of Current or Ex-Partner: Not on file  . Emotionally Abused: Not on file  . Physically Abused: Not on file  . Sexually Abused: Not on file    Family History  Problem Relation Age of Onset  . Diabetes Mother   . Sickle cell anemia Brother   . Breast cancer Neg Hx   .  Ovarian cancer Neg Hx   . Colon cancer Neg Hx     The following portions of the patient's history were reviewed and updated as appropriate: allergies, current medications, past family history, past medical history, past social history, past surgical history and problem list.  Review of Systems  ROS negative except as noted above. Information obtained from patient.  Objective:   BP (!) 92/59   Pulse 93   Ht 5\' 8"  (1.727 m)   Wt 162 lb 1.6 oz (73.5 kg)   LMP 02/19/2019 (Approximate)   BMI 24.65 kg/m    CONSTITUTIONAL: Well-developed, well-nourished female in no acute distress.    PHYSICAL EXAM: Not indicated.   Recent Results (from the past 2160 hour(s))  WET PREP FOR TRICH, YEAST, CLUE     Status: Abnormal   Collection Time: 03/13/19  3:00 PM  Result Value Ref Range   Trichomonas Exam Positive (A) Negative   Yeast Exam Negative Negative   Clue Cell  Exam Comment: Negative    Comment:  neg;amine neg  POCT urine pregnancy     Status: Abnormal   Collection Time: 04/17/19  2:12 PM  Result Value Ref Range   Preg Test, Ur Positive (A) Negative     Assessment:   1. Amenorrhea  - POCT urine pregnancy - Urine cytology ancillary only  2. History of trichomoniasis  - Urine cytology ancillary only  3. History of chlamydia  - Urine cytology ancillary only     Plan:   Urine sent for test of cure today, see orders.   First trimester education, see AVS.  Samples of prenatal vitamins given.   Reviewed red flag symptoms and when to call.   RTC x 2 weeks for dating/viability ultrasound and nurse intake.   RTC x 4 weeks for NOB PE or sooner if needed.    04/19/19, CNM [facility], [facility] 04/18/19 3:33 PM   A total of 20 minutes were spent face-to-face with the patient during the encounter with greater than 50% dealing with counseling and coordination of care.

## 2019-04-17 NOTE — Patient Instructions (Signed)
Eating Plan for Pregnant Women While you are pregnant, your body requires additional nutrition to help support your growing baby. You also have a higher need for some vitamins and minerals, such as folic acid, calcium, iron, and vitamin D. Eating a healthy, well-balanced diet is very important for your health and your baby's health. Your need for extra calories varies for the three 80-monthsegments of your pregnancy (trimesters). For most women, it is recommended to consume:  150 extra calories a day during the first trimester.  300 extra calories a day during the second trimester.  300 extra calories a day during the third trimester. What are tips for following this plan?   Do not try to lose weight or go on a diet during pregnancy.  Limit your overall intake of foods that have "empty calories." These are foods that have little nutritional value, such as sweets, desserts, candies, and sugar-sweetened beverages.  Eat a variety of foods (especially fruits and vegetables) to get a full range of vitamins and minerals.  Take a prenatal vitamin to help meet your additional vitamin and mineral needs during pregnancy, specifically for folic acid, iron, calcium, and vitamin D.  Remember to stay active. Ask your health care provider what types of exercise and activities are safe for you.  Practice good food safety and cleanliness. Wash your hands before you eat and after you prepare raw meat. Wash all fruits and vegetables well before peeling or eating. Taking these actions can help to prevent food-borne illnesses that can be very dangerous to your baby, such as listeriosis. Ask your health care provider for more information about listeriosis. What does 150 extra calories look like? Healthy options that provide 150 extra calories each day could be any of the following:  6-8 oz (170-230 g) of plain low-fat yogurt with  cup of berries.  1 apple with 2 teaspoons (11 g) of peanut butter.  Cut-up  vegetables with  cup (60 g) of hummus.  8 oz (230 mL) or 1 cup of low-fat chocolate milk.  1 stick of string cheese with 1 medium orange.  1 peanut butter and jelly sandwich that is made with one slice of whole-wheat bread and 1 tsp (5 g) of peanut butter. For 300 extra calories, you could eat two of those healthy options each day. What is a healthy amount of weight to gain? The right amount of weight gain for you is based on your BMI before you became pregnant. If your BMI:  Was less than 18 (underweight), you should gain 28-40 lb (13-18 kg).  Was 18-24.9 (normal), you should gain 25-35 lb (11-16 kg).  Was 25-29.9 (overweight), you should gain 15-25 lb (7-11 kg).  Was 30 or greater (obese), you should gain 11-20 lb (5-9 kg). What if I am having twins or multiples? Generally, if you are carrying twins or multiples:  You may need to eat 300-600 extra calories a day.  The recommended range for total weight gain is 25-54 lb (11-25 kg), depending on your BMI before pregnancy.  Talk with your health care provider to find out about nutritional needs, weight gain, and exercise that is right for you. What foods can I eat?  Fruits All fruits. Eat a variety of colors and types of fruit. Remember to wash your fruits well before peeling or eating. Vegetables All vegetables. Eat a variety of colors and types of vegetables. Remember to wash your vegetables well before peeling or eating. Grains All grains. Choose whole grains, such  as whole-wheat bread, oatmeal, or brown rice. Meats and other protein foods Lean meats, including chicken, [place], fish, and lean cuts of beef, veal, or pork. If you eat fish or seafood, choose options that are higher in omega-3 fatty acids and lower in mercury, such as salmon, herring, mussels, trout, sardines, pollock, shrimp, crab, and lobster. Tofu. Tempeh. Beans. Eggs. Peanut butter and other nut butters. Make sure that all meats, poultry, and eggs are cooked to  food-safe temperatures or "well-done." Two or more servings of fish are recommended each week in order to get the most benefits from omega-3 fatty acids that are found in seafood. Choose fish that are lower in mercury. You can find more information online:  GuamGaming.ch Dairy Pasteurized milk and milk alternatives (such as almond milk). Pasteurized yogurt and pasteurized cheese. Cottage cheese. Sour cream. Beverages Water. Juices that contain 100% fruit juice or vegetable juice. Caffeine-free teas and decaffeinated coffee. Drinks that contain caffeine are okay to drink, but it is better to avoid caffeine. Keep your total caffeine intake to less than 200 mg each day (which is 12 oz or 355 mL of coffee, tea, or soda) or the limit as told by your health care provider. Fats and oils Fats and oils are okay to include in moderation. Sweets and desserts Sweets and desserts are okay to include in moderation. Seasoning and other foods All pasteurized condiments. The items listed above may not be a complete list of foods and beverages you can eat. Contact a dietitian for more information. What foods are not recommended? Fruits Unpasteurized fruit juices. Vegetables Raw (unpasteurized) vegetable juices. Meats and other protein foods Lunch meats, bologna, hot dogs, or other deli meats. (If you must eat those meats, reheat them until they are steaming hot.) Refrigerated pat, meat spreads from a meat counter, smoked seafood that is found in the refrigerated section of a store. Raw or undercooked meats, poultry, and eggs. Raw fish, such as sushi or sashimi. Fish that have high mercury content, such as tilefish, shark, swordfish, and king mackerel. To learn more about mercury in fish, talk with your health care provider or look for online resources, such as:  [place]Gaming.ch Dairy Raw (unpasteurized) milk and any foods that have raw milk in them. Soft cheeses, such as feta, queso blanco, queso fresco, Brie,  Camembert cheeses, blue-veined cheeses, and Panela cheese (unless it is made with pasteurized milk, which must be stated on the label). Beverages Alcohol. Sugar-sweetened beverages, such as sodas, teas, or energy drinks. Seasoning and other foods Homemade fermented foods and drinks, such as pickles, sauerkraut, or kombucha drinks. (Store-bought pasteurized versions of these are okay.) Salads that are made in a store or deli, such as ham salad, chicken salad, egg salad, tuna salad, and seafood salad. The items listed above may not be a complete list of foods and beverages you should avoid. Contact a dietitian for more information. Where to find more information To calculate the number of calories you need based on your height, weight, and activity level, you can use an online calculator such as:  MobileTransition.ch To calculate how much weight you should gain during pregnancy, you can use an online pregnancy weight gain calculator such as:  StreamingFood.com.cy Summary  While you are pregnant, your body requires additional nutrition to help support your growing baby.  Eat a variety of foods, especially fruits and vegetables to get a full range of vitamins and minerals.  Practice good food safety and cleanliness. Wash your hands before you eat  and after you prepare raw meat. Wash all fruits and vegetables well before peeling or eating. Taking these actions can help to prevent food-borne illnesses, such as listeriosis, that can be very dangerous to your baby.  Do not eat raw meat or fish. Do not eat fish that have high mercury content, such as tilefish, shark, swordfish, and king mackerel. Do not eat unpasteurized (raw) dairy.  Take a prenatal vitamin to help meet your additional vitamin and mineral needs during pregnancy, specifically for folic acid, iron, calcium, and vitamin D. This information is not intended to replace advice given to you by  your health care provider. Make sure you discuss any questions you have with your health care provider. Document Revised: 06/26/2018 Document Reviewed: 11/02/2016 Elsevier Patient Education  [place] Weight Gain During Pregnancy, Adult A certain amount of weight gain during pregnancy is normal and healthy. How much weight you should gain depends on your overall health and a measurement called BMI (body mass index). BMI is an estimate of your body fat based on your height and weight. You can use an Freight forwarder to figure out your BMI, or you can ask your health care provider to calculate it for you at your next visit. Your recommended pregnancy weight gain is based on your pre-pregnancy BMI. General guidelines for a healthy total weight gain during pregnancy are listed below. If your BMI at or before the start of your pregnancy is:  Less than 18.5 (underweight), you should gain 28-40 lb (13-18 kg).  18.5-24.9 (normal weight), you should gain 25-35 lb (11-16 kg).  25-29.9 (overweight), you should gain 15-25 lb (7-11 kg).  30 or higher (obese), you should gain 11-20 lb (5-9 kg). These ranges vary depending on your individual health. If you are carrying more than one baby (multiples), it may be safe to gain more weight than these recommendations. If you gain less weight than recommended, that may be safe as long as your baby is growing and developing normally. How can unhealthy weight gain affect me and my baby? Gaining too much weight during pregnancy can lead to pregnancy complications, such as:  A temporary form of diabetes that develops during pregnancy (gestational diabetes).  High blood pressure during pregnancy and protein in your urine (preeclampsia).  High blood pressure during pregnancy without protein in your urine (gestational hypertension).  Your baby having a high weight at birth, which may: ? Raise your risk of having a more difficult delivery or a surgical  delivery (cesarean delivery, or C-section). ? Raise your child's risk of developing obesity during childhood. Not gaining enough weight can be life-threatening for your baby, and it may raise your baby's chances of:  Being born early (preterm).  Growing more slowly than normal during pregnancy (growth restriction).  Having a low weight at birth. What actions can I take to gain a healthy amount of weight during pregnancy? General instructions  Keep track of your weight gain during pregnancy.  Take over-the-counter and prescription medicines only as told by your health care provider. Take all prenatal supplements as directed.  Keep all health care visits during pregnancy (prenatal visits). These visits are a good time to discuss your weight gain. Your health care provider will weigh you at each visit to make sure you are gaining a healthy amount of weight. Nutrition   Eat a balanced, nutrient-rich diet. Eat plenty of: ? Fruits and vegetables, such as berries and broccoli. ? Whole grains, such as millet, barley, whole-wheat breads  and cereals, and oatmeal. ? Low-fat dairy products or non-dairy products such as almond milk or rice milk. ? Protein foods, such as lean meat, chicken, eggs, and legumes (such as peas, beans, soybeans, and lentils).  Avoid foods that are fried or have a lot of fat, salt (sodium), or sugar.  Drink enough fluid to keep your urine pale yellow.  Choose healthy snack and drink options when you are at work or on the go: ? Drink water. Avoid soda, sports drinks, and juices that have added sugar. ? Avoid drinks with caffeine, such as coffee and energy drinks. ? Eat snacks that are high in protein, such as nuts, protein bars, and low-fat yogurt. ? Carry convenient snacks in your purse that do not need refrigeration, such as a pack of trail mix, an apple, or a granola bar.  If you need help improving your diet, work with a health care provider or a diet and nutrition  specialist (dietitian). Activity   Exercise regularly, as told by your health care provider. ? If you were active before becoming pregnant, you may be able to continue your regular fitness activities. ? If you were not active before pregnancy, you may gradually build up to exercising for 30 or more minutes on most days of the week. This may include walking, swimming, or yoga.  Ask your health care provider what activities are safe for you. Talk with your health care provider about whether you may need to be excused from certain school or work activities. Where to find more information Learn more about managing your weight gain during pregnancy from:  American Pregnancy Association: www.americanpregnancy.org  U.[place]. Department of Agriculture pregnancy weight gain calculator: FormerBoss.no Summary  Too much weight gain during pregnancy can lead to complications for you and your baby.  Find out your pre-pregnancy BMI to determine how much weight gain is healthy for you.  Eat nutritious foods and stay active.  Keep all of your prenatal visits as told by your health care provider. This information is not intended to replace advice given to you by your health care provider. Make sure you discuss any questions you have with your health care provider. Document Revised: 10/29/2018 Document Reviewed: 10/26/2016 Elsevier Patient Education  [place]. Common Medications Safe in Pregnancy  Acne:      Constipation:  Benzoyl Peroxide     Colace  Clindamycin      Dulcolax Suppository  Topica Erythromycin     Fibercon  Salicylic Acid      Metamucil         Miralax AVOID:        Senakot   Accutane    Cough:  Retin-A       Cough Drops  Tetracycline      Phenergan w/ Codeine if Rx  Minocycline      Robitussin (Plain &  DM)  Antibiotics:     Crabs/Lice:  Ceclor       RID  Cephalosporins    AVOID:  E-Mycins      Kwell  Keflex  Macrobid/Macrodantin   Diarrhea:  Penicillin      Kao-Pectate  Zithromax      Imodium AD         PUSH FLUIDS AVOID:       Cipro     Fever:  Tetracycline      Tylenol (Regular or Extra  Minocycline       Strength)  Levaquin      Extra Strength-Do not  Exceed 8 tabs/24 hrs Caffeine:        <235m/day (equiv. To 1 cup of coffee or  approx. 3 12 oz sodas)         Gas: Cold/Hayfever:       Gas-X  Benadryl      Mylicon  Claritin       Phazyme  **Claritin-D        Chlor-Trimeton    Headaches:  Dimetapp      ASA-Free Excedrin  Drixoral-Non-Drowsy     Cold Compress  Mucinex (Guaifenasin)     Tylenol (Regular or Extra  Sudafed/Sudafed-12 Hour     Strength)  **Sudafed PE Pseudoephedrine   Tylenol Cold & Sinus     Vicks Vapor Rub  Zyrtec  **AVOID if Problems With Blood Pressure         Heartburn: Avoid lying down for at least 1 hour after meals  Aciphex      Maalox     Rash:  Milk of Magnesia     Benadryl    Mylanta       1% Hydrocortisone Cream  Pepcid  Pepcid Complete   Sleep Aids:  Prevacid      Ambien   Prilosec       Benadryl  Rolaids       Chamomile Tea  Tums (Limit 4/day)     Unisom  Zantac       Tylenol PM         Warm milk-add vanilla or  Hemorrhoids:       Sugar for taste  Anusol/Anusol H.C.  (RX: Analapram 2.5%)  Sugar Substitutes:  Hydrocortisone OTC     Ok in moderation  Preparation H      Tucks        Vaseline lotion applied to tissue with wiping    Herpes:     Throat:  Acyclovir      Oragel  Famvir  Valtrex     Vaccines:         Flu Shot Leg Cramps:       *Gardasil  Benadryl      Hepatitis A         Hepatitis B Nasal Spray:       Pneumovax  Saline Nasal Spray     Polio Booster         Tetanus Nausea:       Tuberculosis test or PPD  Vitamin B6 25 mg TID   AVOID:    Dramamine      *Gardasil  Emetrol       Live  Poliovirus  Ginger Root 250 mg QID    MMR (measles, mumps &  High Complex Carbs @ Bedtime    rebella)  Sea Bands-Accupressure    Varicella (Chickenpox)  Unisom 1/2 tab TID     *No known complications           If received before Pain:         Known pregnancy;   Darvocet       Resume series after  Lortab        Delivery  Percocet    Yeast:   Tramadol      Femstat  Tylenol 3      Gyne-lotrimin  Ultram       Monistat  Vicodin           MISC:         All Sunscreens  Hair Coloring/highlights          Insect Repellant's          (Including DEET)         Mystic Tans First Trimester of Pregnancy  The first trimester of pregnancy is from week 1 until the end of week 13 (months 1 through 3). During this time, your baby will begin to develop inside you. At 6-8 weeks, the eyes and face are formed, and the heartbeat can be seen on ultrasound. At the end of 12 weeks, all the baby's organs are formed. Prenatal care is all the medical care you receive before the birth of your baby. Make sure you get good prenatal care and follow all of your doctor's instructions. Follow these instructions at home: Medicines  Take over-the-counter and prescription medicines only as told by your doctor. Some medicines are safe and some medicines are not safe during pregnancy.  Take a prenatal vitamin that contains at least 600 micrograms (mcg) of folic acid.  If you have trouble pooping (constipation), take medicine that will make your stool soft (stool softener) if your doctor approves. Eating and drinking   Eat regular, healthy meals.  Your doctor will tell you the amount of weight gain that is right for you.  Avoid raw meat and uncooked cheese.  If you feel sick to your stomach (nauseous) or throw up (vomit): ? Eat 4 or 5 small meals a day instead of 3 large meals. ? Try eating a few soda crackers. ? Drink liquids between meals instead of during meals.  To prevent constipation: ? Eat foods  that are high in fiber, like fresh fruits and vegetables, whole grains, and beans. ? Drink enough fluids to keep your pee (urine) clear or pale yellow. Activity  Exercise only as told by your doctor. Stop exercising if you have cramps or pain in your lower belly (abdomen) or low back.  Do not exercise if it is too hot, too humid, or if you are in a place of great height (high altitude).  Try to avoid standing for long periods of time. Move your legs often if you must stand in one place for a long time.  Avoid heavy lifting.  Wear low-heeled shoes. Sit and stand up straight.  You can have sex unless your doctor tells you not to. Relieving pain and discomfort  Wear a good support bra if your breasts are sore.  Take warm water baths (sitz baths) to soothe pain or discomfort caused by hemorrhoids. Use hemorrhoid cream if your doctor says it is okay.  Rest with your legs raised if you have leg cramps or low back pain.  If you have puffy, bulging veins (varicose veins) in your legs: ? Wear support hose or compression stockings as told by your doctor. ? Raise (elevate) your feet for 15 minutes, 3-4 times a day. ? Limit salt in your food. Prenatal care  Schedule your prenatal visits by the twelfth week of pregnancy.  Write down your questions. Take them to your prenatal visits.  Keep all your prenatal visits as told by your doctor. This is important. Safety  Wear your seat belt at all times when driving.  Make a list of emergency phone numbers. The list should include numbers for family, friends, the hospital, and police and fire departments. General instructions  Ask your doctor for a referral to a local prenatal class. Begin classes no later than at the start of month 6 of your pregnancy.  Ask  for help if you need counseling or if you need help with nutrition. Your doctor can give you advice or tell you where to go for help.  Do not use hot tubs, steam rooms, or saunas.  Do  not douche or use tampons or scented sanitary pads.  Do not cross your legs for long periods of time.  Avoid all herbs and alcohol. Avoid drugs that are not approved by your doctor.  Do not use any tobacco products, including cigarettes, chewing tobacco, and electronic cigarettes. If you need help quitting, ask your doctor. You may get counseling or other support to help you quit.  Avoid cat litter boxes and soil used by cats. These carry germs that can cause birth defects in the baby and can cause a loss of your baby (miscarriage) or stillbirth.  Visit your dentist. At home, brush your teeth with a soft toothbrush. Be gentle when you floss. Contact a doctor if:  You are dizzy.  You have mild cramps or pressure in your lower belly.  You have a nagging pain in your belly area.  You continue to feel sick to your stomach, you throw up, or you have watery poop (diarrhea).  You have a bad smelling fluid coming from your vagina.  You have pain when you pee (urinate).  You have increased puffiness (swelling) in your face, hands, legs, or ankles. Get help right away if:  You have a fever.  You are leaking fluid from your vagina.  You have spotting or bleeding from your vagina.  You have very bad belly cramping or pain.  You gain or lose weight rapidly.  You throw up blood. It may look like coffee grounds.  You are around people who have [place] measles, fifth disease, or chickenpox.  You have a very bad headache.  You have shortness of breath.  You have any kind of trauma, such as from a fall or a car accident. Summary  The first trimester of pregnancy is from week 1 until the end of week 13 (months 1 through 3).  To take care of yourself and your unborn baby, you will need to eat healthy meals, take medicines only if your doctor tells you to do so, and do activities that are safe for you and your baby.  Keep all follow-up visits as told by your doctor. This is important  as your doctor will have to ensure that your baby is healthy and growing well. This information is not intended to replace advice given to you by your health care provider. Make sure you discuss any questions you have with your health care provider. Document Revised: 05/29/2018 Document Reviewed: 02/14/2016 Elsevier Patient Education  2020 [employer].

## 2019-04-18 DIAGNOSIS — Z8619 Personal history of other infectious and parasitic diseases: Secondary | ICD-10-CM | POA: Insufficient documentation

## 2019-04-21 LAB — URINE CYTOLOGY ANCILLARY ONLY
Chlamydia: NEGATIVE
Comment: NEGATIVE
Comment: NEGATIVE
Comment: NORMAL
Neisseria Gonorrhea: NEGATIVE
Trichomonas: NEGATIVE

## 2019-05-06 NOTE — Progress Notes (Signed)
Abigail Curry presents for NOB nurse interview visit.LMP 02/19/19. Pregnancy confirmation done 04/17/19 JML. 05/07/19 U/S-18w, CRL 120.7,FH 142, EDD 10/08/19. G 3  P 1 0 1 1. Pregnancy education material explained and given. 0 cats in the home. NOB labs ordered . HIV labs and Drug screen were explained optional and she did not decline. Drug screen ordered. PNV encouraged. Genetic screening options discussed. Genetic testing: Declined Pt may discuss with provider. Pt. To follow up with provider in _1_ weeks for NOB physical.  All questions answered. FMLA papers not done- patient does not work outside of home. Financial paper reviewed. Did not want FOB listed.

## 2019-05-07 ENCOUNTER — Ambulatory Visit (INDEPENDENT_AMBULATORY_CARE_PROVIDER_SITE_OTHER): Payer: Medicaid Other | Admitting: Certified Nurse Midwife

## 2019-05-07 ENCOUNTER — Other Ambulatory Visit: Payer: Self-pay

## 2019-05-07 ENCOUNTER — Other Ambulatory Visit: Payer: Self-pay | Admitting: Certified Nurse Midwife

## 2019-05-07 ENCOUNTER — Ambulatory Visit (INDEPENDENT_AMBULATORY_CARE_PROVIDER_SITE_OTHER): Payer: Medicaid Other

## 2019-05-07 VITALS — BP 108/58 | HR 92 | Wt 179.4 lb

## 2019-05-07 DIAGNOSIS — Z3A18 18 weeks gestation of pregnancy: Secondary | ICD-10-CM | POA: Diagnosis not present

## 2019-05-07 DIAGNOSIS — Z3689 Encounter for other specified antenatal screening: Secondary | ICD-10-CM

## 2019-05-07 DIAGNOSIS — Z789 Other specified health status: Secondary | ICD-10-CM

## 2019-05-07 DIAGNOSIS — Z3482 Encounter for supervision of other normal pregnancy, second trimester: Secondary | ICD-10-CM

## 2019-05-07 NOTE — Progress Notes (Signed)
I have reviewed the record and concur with patient management and plan of care.    Gunnar Bulla, CNM [facility], [facility] 05/07/19 11:18 AM

## 2019-05-08 ENCOUNTER — Encounter: Payer: Self-pay | Admitting: Certified Nurse Midwife

## 2019-05-08 DIAGNOSIS — Z2839 Other underimmunization status: Secondary | ICD-10-CM | POA: Insufficient documentation

## 2019-05-08 DIAGNOSIS — Z283 Underimmunization status: Secondary | ICD-10-CM | POA: Insufficient documentation

## 2019-05-08 DIAGNOSIS — O09899 Supervision of other high risk pregnancies, unspecified trimester: Secondary | ICD-10-CM | POA: Insufficient documentation

## 2019-05-08 LAB — CBC WITH DIFFERENTIAL
Basophils Absolute: 0.1 10*3/uL (ref 0.0–0.2)
Basos: 0 %
EOS (ABSOLUTE): 0.2 10*3/uL (ref 0.0–0.4)
Eos: 1 %
Hematocrit: 33.3 % — ABNORMAL LOW (ref 34.0–46.6)
Hemoglobin: 11.2 g/dL (ref 11.1–15.9)
Immature Grans (Abs): 0.1 10*3/uL (ref 0.0–0.1)
Immature Granulocytes: 1 %
Lymphocytes Absolute: 1.3 10*3/uL (ref 0.7–3.1)
Lymphs: 10 %
MCH: 30.6 pg (ref 26.6–33.0)
MCHC: 33.6 g/dL (ref 31.5–35.7)
MCV: 91 fL (ref 79–97)
Monocytes Absolute: 0.7 10*3/uL (ref 0.1–0.9)
Monocytes: 6 %
Neutrophils Absolute: 10.5 10*3/uL — ABNORMAL HIGH (ref 1.4–7.0)
Neutrophils: 82 %
RBC: 3.66 x10E6/uL — ABNORMAL LOW (ref 3.77–5.28)
RDW: 14 % (ref 11.7–15.4)
WBC: 12.9 10*3/uL — ABNORMAL HIGH (ref 3.4–10.8)

## 2019-05-08 LAB — URINALYSIS, ROUTINE W REFLEX MICROSCOPIC
Bilirubin, UA: NEGATIVE
Glucose, UA: NEGATIVE
Ketones, UA: NEGATIVE
Leukocytes,UA: NEGATIVE
Nitrite, UA: NEGATIVE
Protein,UA: NEGATIVE
RBC, UA: NEGATIVE
Specific Gravity, UA: 1.022 (ref 1.005–1.030)
Urobilinogen, Ur: 0.2 mg/dL (ref 0.2–1.0)
pH, UA: 7 (ref 5.0–7.5)

## 2019-05-08 LAB — VARICELLA ZOSTER ANTIBODY, IGG: Varicella zoster IgG: <135 index — ABNORMAL LOW (ref >165)

## 2019-05-08 LAB — ABO AND RH: Rh Factor: POSITIVE

## 2019-05-08 LAB — RUBELLA SCREEN: Rubella Antibodies, IGG: <0.9 index — ABNORMAL LOW (ref >0.99)

## 2019-05-08 LAB — RPR: RPR Ser Ql: NONREACTIVE

## 2019-05-08 LAB — HIV ANTIBODY (ROUTINE TESTING W REFLEX): HIV Screen 4th Generation wRfx: NONREACTIVE

## 2019-05-08 LAB — HEPATITIS B SURFACE ANTIGEN: Hepatitis B Surface Ag: NEGATIVE

## 2019-05-08 LAB — ANTIBODY SCREEN: Antibody Screen: NEGATIVE

## 2019-05-09 LAB — URINE CULTURE: Organism ID, Bacteria: NO GROWTH

## 2019-05-10 LAB — GC/CHLAMYDIA PROBE AMP
Chlamydia trachomatis, NAA: NEGATIVE
Neisseria Gonorrhoeae by PCR: NEGATIVE

## 2019-05-13 LAB — MONITOR DRUG PROFILE 14(MW)
Amphetamine Scrn, Ur: NEGATIVE ng/mL
BARBITURATE SCREEN URINE: NEGATIVE ng/mL
BENZODIAZEPINE SCREEN, URINE: NEGATIVE ng/mL
Buprenorphine, Urine: NEGATIVE ng/mL
Cocaine (Metab) Scrn, Ur: NEGATIVE ng/mL
Creatinine(Crt), U: 132.5 mg/dL (ref 20.0–300.0)
Fentanyl, Urine: NEGATIVE pg/mL
Meperidine Screen, Urine: NEGATIVE ng/mL
Methadone Screen, Urine: NEGATIVE ng/mL
OXYCODONE+OXYMORPHONE UR QL SCN: NEGATIVE ng/mL
Opiate Scrn, Ur: NEGATIVE ng/mL
Ph of Urine: 6.8 (ref 4.5–8.9)
Phencyclidine Qn, Ur: NEGATIVE ng/mL
Propoxyphene Scrn, Ur: NEGATIVE ng/mL
SPECIFIC GRAVITY: 1.026
Tramadol Screen, Urine: NEGATIVE ng/mL

## 2019-05-13 LAB — CANNABINOID (GC/MS), URINE
Cannabinoid: POSITIVE — AB
Carboxy THC (GC/MS): >300 ng/mL

## 2019-05-14 ENCOUNTER — Encounter: Payer: Self-pay | Admitting: Certified Nurse Midwife

## 2019-05-14 DIAGNOSIS — F1291 Cannabis use, unspecified, in remission: Secondary | ICD-10-CM | POA: Insufficient documentation

## 2019-05-14 DIAGNOSIS — Z87898 Personal history of other specified conditions: Secondary | ICD-10-CM | POA: Insufficient documentation

## 2019-05-15 ENCOUNTER — Encounter: Payer: Medicaid Other | Admitting: Certified Nurse Midwife

## 2019-05-25 ENCOUNTER — Encounter: Payer: Medicaid Other | Admitting: Certified Nurse Midwife

## 2019-07-30 ENCOUNTER — Other Ambulatory Visit: Payer: Medicaid Other

## 2019-07-30 ENCOUNTER — Other Ambulatory Visit: Payer: Self-pay | Admitting: Certified Nurse Midwife

## 2019-07-30 ENCOUNTER — Other Ambulatory Visit: Payer: Self-pay

## 2019-07-30 ENCOUNTER — Encounter: Payer: Medicaid Other | Admitting: Certified Nurse Midwife

## 2019-07-30 DIAGNOSIS — Z3689 Encounter for other specified antenatal screening: Secondary | ICD-10-CM

## 2019-08-12 ENCOUNTER — Ambulatory Visit (INDEPENDENT_AMBULATORY_CARE_PROVIDER_SITE_OTHER): Payer: Medicaid Other | Admitting: Certified Nurse Midwife

## 2019-08-12 ENCOUNTER — Encounter: Payer: Self-pay | Admitting: Certified Nurse Midwife

## 2019-08-12 ENCOUNTER — Other Ambulatory Visit: Payer: Self-pay

## 2019-08-12 VITALS — BP 97/63 | HR 85 | Wt 204.1 lb

## 2019-08-12 DIAGNOSIS — O99891 Other specified diseases and conditions complicating pregnancy: Secondary | ICD-10-CM

## 2019-08-12 DIAGNOSIS — Z87898 Personal history of other specified conditions: Secondary | ICD-10-CM | POA: Diagnosis not present

## 2019-08-12 DIAGNOSIS — O09899 Supervision of other high risk pregnancies, unspecified trimester: Secondary | ICD-10-CM

## 2019-08-12 DIAGNOSIS — Z3A31 31 weeks gestation of pregnancy: Secondary | ICD-10-CM

## 2019-08-12 DIAGNOSIS — Z23 Encounter for immunization: Secondary | ICD-10-CM | POA: Diagnosis not present

## 2019-08-12 DIAGNOSIS — F1291 Cannabis use, unspecified, in remission: Secondary | ICD-10-CM

## 2019-08-12 DIAGNOSIS — Z283 Underimmunization status: Secondary | ICD-10-CM

## 2019-08-12 LAB — POCT URINALYSIS DIPSTICK OB
Bilirubin, UA: NEGATIVE
Blood, UA: NEGATIVE
Glucose, UA: NEGATIVE
Ketones, UA: NEGATIVE
Leukocytes, UA: NEGATIVE
Nitrite, UA: NEGATIVE
POC,PROTEIN,UA: NEGATIVE
Spec Grav, UA: 1.015 (ref 1.010–1.025)
Urobilinogen, UA: 0.2 E.U./dL
pH, UA: 5 (ref 5.0–8.0)

## 2019-08-12 MED ORDER — TETANUS-DIPHTH-ACELL PERTUSSIS 5-2.5-18.5 LF-MCG/0.5 IM SUSP
0.5000 mL | Freq: Once | INTRAMUSCULAR | Status: AC
  Administered 2019-08-12: 0.5 mL via INTRAMUSCULAR

## 2019-08-12 NOTE — Progress Notes (Signed)
ROB doing well. Feels good fetal movement. Pt has missed several ROB appointments due to lack of money to buy gas for her car. 28 wk labs collected today. Tdap/BTC completed. Discussed birth plan, sample given. Will follow up next visit. She is undecided on birth control . Pamphlet given with information. She plans to formula feed . The baby's name is [name]". PTL precautions reviewed. Rose into see pt to discuss transportation needs. Follow up 2 wk with Marcelino Duster.   Doreene Burke, CNM

## 2019-08-12 NOTE — Patient Instructions (Signed)
Td (Tetanus, Diphtheria) Vaccine: What You Need to Know 1. Why get vaccinated? Td vaccine can prevent tetanus and diphtheria. Tetanus enters the body through cuts or wounds. Diphtheria spreads from person to person.  TETANUS (T) causes painful stiffening of the muscles. Tetanus can lead to serious health problems, including being unable to open the mouth, having trouble swallowing and breathing, or death.  DIPHTHERIA (D) can lead to difficulty breathing, heart failure, paralysis, or death. 2. Td vaccine Td is only for children 7 years and older, adolescents, and adults.  Td is usually given as a booster dose every 10 years, but it can also be given earlier after a severe and dirty wound or burn. Another vaccine, called Tdap, that protects against pertussis, also known as "whooping cough," in addition to tetanus and diphtheria, may be used instead of Td.  Td may be given at the same time as other vaccines. 3. Talk with your health care provider Tell your vaccine provider if the person getting the vaccine:  Has had an allergic reaction after a previous dose of any vaccine that protects against tetanus or diphtheria, or has any severe, life-threatening allergies.  Has ever had Guillain-Barr Syndrome (also called GBS).  Has had severe pain or swelling after a previous dose of any vaccine that protects against tetanus or diphtheria. In some cases, your health care provider may decide to postpone Td vaccination to a future visit.  People with minor illnesses, such as a cold, may be vaccinated. People who are moderately or severely ill should usually wait until they recover before getting Td vaccine.  Your health care provider can give you more information. 4. Risks of a vaccine reaction  Pain, redness, or swelling where the shot was given, mild fever, headache, feeling tired, and nausea, vomiting, diarrhea, or stomachache sometimes happen after Td vaccine. People sometimes faint after medical  procedures, including vaccination. Tell your provider if you feel dizzy or have vision changes or ringing in the ears.  As with any medicine, there is a very remote chance of a vaccine causing a severe allergic reaction, other serious injury, or death. 5. What if there is a serious problem? An allergic reaction could occur after the vaccinated person leaves the clinic. If you see signs of a severe allergic reaction (hives, swelling of the face and throat, difficulty breathing, a fast heartbeat, dizziness, or weakness), call 9-1-1 and get the person to the nearest hospital.  For other signs that concern you, call your health care provider.  Adverse reactions should be reported to the Vaccine Adverse Event Reporting System (VAERS). Your health care provider will usually file this report, or you can do it yourself. Visit the VAERS website at www.vaers.hhs.gov or call 1-800-822-7967. VAERS is only for reporting reactions, and VAERS staff do not give medical advice. 6. The National Vaccine Injury Compensation Program The National Vaccine Injury Compensation Program (VICP) is a federal program that was created to compensate people who may have been injured by certain vaccines. Visit the VICP website at www.hrsa.gov/vaccinecompensation or call 1-800-338-2382 to learn about the program and about filing a claim. There is a time limit to file a claim for compensation. 7. How can I learn more?  Ask your health care provider.  Call your local or state health department.  Contact the Centers for Disease Control and Prevention (CDC): ? Call 1-800-232-4636 (1-800-CDC-INFO) or ? Visit CDC's website at www.cdc.gov/vaccines Vaccine Information Statement Td Vaccine (05/21/18) This information is not intended to replace advice given   to you by your health care provider. Make sure you discuss any questions you have with your health care provider. Document Revised: 06/30/2018 Document Reviewed: 06/02/2018 Elsevier  Patient Education  2020 Elsevier Inc.  

## 2019-08-13 LAB — CBC
Hematocrit: 33.9 % — ABNORMAL LOW (ref 34.0–46.6)
Hemoglobin: 11 g/dL — ABNORMAL LOW (ref 11.1–15.9)
MCH: 27.8 pg (ref 26.6–33.0)
MCHC: 32.4 g/dL (ref 31.5–35.7)
MCV: 86 fL (ref 79–97)
Platelets: 309 10*3/uL (ref 150–450)
RBC: 3.96 x10E6/uL (ref 3.77–5.28)
RDW: 15.1 % (ref 11.7–15.4)
WBC: 20.6 10*3/uL (ref 3.4–10.8)

## 2019-08-13 LAB — GLUCOSE, 1 HOUR GESTATIONAL: Gestational Diabetes Screen: 126 mg/dL (ref 65–139)

## 2019-08-13 LAB — RPR: RPR Ser Ql: NONREACTIVE

## 2019-08-21 ENCOUNTER — Telehealth: Payer: Self-pay

## 2019-08-21 ENCOUNTER — Observation Stay: Payer: Medicaid Other | Admitting: Certified Registered Nurse Anesthetist

## 2019-08-21 ENCOUNTER — Inpatient Hospital Stay
Admission: EM | Admit: 2019-08-21 | Discharge: 2019-08-24 | DRG: 787 | Disposition: A | Discharge status: Home / Self Care | Payer: Medicaid Other | Attending: Obstetrics and Gynecology | Admitting: Obstetrics and Gynecology

## 2019-08-21 ENCOUNTER — Inpatient Hospital Stay: Payer: Medicaid Other

## 2019-08-21 ENCOUNTER — Encounter: Payer: Self-pay | Admitting: Obstetrics and Gynecology

## 2019-08-21 ENCOUNTER — Telehealth: Payer: Self-pay | Admitting: Certified Nurse Midwife

## 2019-08-21 ENCOUNTER — Encounter
Admission: EM | Disposition: A | Discharge status: Home / Self Care | Payer: Self-pay | Source: Home / Self Care | Attending: Obstetrics and Gynecology

## 2019-08-21 ENCOUNTER — Other Ambulatory Visit: Payer: Self-pay

## 2019-08-21 DIAGNOSIS — O4593 Premature separation of placenta, unspecified, third trimester: Principal | ICD-10-CM | POA: Diagnosis present

## 2019-08-21 DIAGNOSIS — F1290 Cannabis use, unspecified, uncomplicated: Secondary | ICD-10-CM | POA: Diagnosis present

## 2019-08-21 DIAGNOSIS — O09899 Supervision of other high risk pregnancies, unspecified trimester: Secondary | ICD-10-CM

## 2019-08-21 DIAGNOSIS — O99891 Other specified diseases and conditions complicating pregnancy: Secondary | ICD-10-CM

## 2019-08-21 DIAGNOSIS — R109 Unspecified abdominal pain: Secondary | ICD-10-CM | POA: Diagnosis present

## 2019-08-21 DIAGNOSIS — Z3A33 33 weeks gestation of pregnancy: Secondary | ICD-10-CM | POA: Diagnosis not present

## 2019-08-21 DIAGNOSIS — O0933 Supervision of pregnancy with insufficient antenatal care, third trimester: Secondary | ICD-10-CM

## 2019-08-21 DIAGNOSIS — Z20822 Contact with and (suspected) exposure to covid-19: Secondary | ICD-10-CM | POA: Diagnosis present

## 2019-08-21 DIAGNOSIS — Z87898 Personal history of other specified conditions: Secondary | ICD-10-CM | POA: Diagnosis not present

## 2019-08-21 DIAGNOSIS — Z3689 Encounter for other specified antenatal screening: Secondary | ICD-10-CM

## 2019-08-21 DIAGNOSIS — Z2839 Other underimmunization status: Secondary | ICD-10-CM

## 2019-08-21 DIAGNOSIS — O0930 Supervision of pregnancy with insufficient antenatal care, unspecified trimester: Secondary | ICD-10-CM

## 2019-08-21 DIAGNOSIS — O99324 Drug use complicating childbirth: Secondary | ICD-10-CM | POA: Diagnosis present

## 2019-08-21 DIAGNOSIS — Z283 Underimmunization status: Secondary | ICD-10-CM

## 2019-08-21 DIAGNOSIS — O4690 Antepartum hemorrhage, unspecified, unspecified trimester: Secondary | ICD-10-CM | POA: Diagnosis not present

## 2019-08-21 DIAGNOSIS — F1291 Cannabis use, unspecified, in remission: Secondary | ICD-10-CM

## 2019-08-21 LAB — URINE DRUG SCREEN, QUALITATIVE (ARMC ONLY)
Amphetamines, Ur Screen: NOT DETECTED
Barbiturates, Ur Screen: NOT DETECTED
Benzodiazepine, Ur Scrn: NOT DETECTED
Cannabinoid 50 Ng, Ur ~~LOC~~: POSITIVE — AB
Cocaine Metabolite,Ur ~~LOC~~: NOT DETECTED
MDMA (Ecstasy)Ur Screen: NOT DETECTED
Methadone Scn, Ur: NOT DETECTED
Opiate, Ur Screen: NOT DETECTED
Phencyclidine (PCP) Ur S: NOT DETECTED
Tricyclic, Ur Screen: NOT DETECTED

## 2019-08-21 LAB — URINALYSIS, COMPLETE (UACMP) WITH MICROSCOPIC
Bacteria, UA: NONE SEEN
Bilirubin Urine: NEGATIVE
Glucose, UA: NEGATIVE mg/dL
Ketones, ur: NEGATIVE mg/dL
Leukocytes,Ua: NEGATIVE
Nitrite: NEGATIVE
Protein, ur: NEGATIVE mg/dL
Specific Gravity, Urine: 1.004 — ABNORMAL LOW (ref 1.005–1.030)
pH: 7 (ref 5.0–8.0)

## 2019-08-21 LAB — CBC
HCT: 34.9 % — ABNORMAL LOW (ref 36.0–46.0)
Hemoglobin: 11.9 g/dL — ABNORMAL LOW (ref 12.0–15.0)
MCH: 28.3 pg (ref 26.0–34.0)
MCHC: 34.1 g/dL (ref 30.0–36.0)
MCV: 82.9 fL (ref 80.0–100.0)
Platelets: 322 10*3/uL (ref 150–400)
RBC: 4.21 MIL/uL (ref 3.87–5.11)
RDW: 16.9 % — ABNORMAL HIGH (ref 11.5–15.5)
WBC: 18.4 10*3/uL — ABNORMAL HIGH (ref 4.0–10.5)
nRBC: 0 % (ref 0.0–0.2)

## 2019-08-21 LAB — GROUP B STREP BY PCR: Group B strep by PCR: NEGATIVE

## 2019-08-21 LAB — WET PREP, GENITAL
Clue Cells Wet Prep HPF POC: NONE SEEN
Sperm: NONE SEEN
Trich, Wet Prep: NONE SEEN
Yeast Wet Prep HPF POC: NONE SEEN

## 2019-08-21 LAB — PROTEIN / CREATININE RATIO, URINE
Creatinine, Urine: 28 mg/dL
Total Protein, Urine: <6 mg/dL

## 2019-08-21 LAB — TYPE AND SCREEN
ABO/RH(D): O POS
Antibody Screen: NEGATIVE

## 2019-08-21 LAB — SARS CORONAVIRUS 2 BY RT PCR (HOSPITAL ORDER, PERFORMED IN ~~LOC~~ HOSPITAL LAB): SARS Coronavirus 2: NEGATIVE

## 2019-08-21 LAB — RUPTURE OF MEMBRANE (ROM)PLUS: Rom Plus: POSITIVE

## 2019-08-21 SURGERY — Surgical Case
Anesthesia: Spinal

## 2019-08-21 MED ORDER — VARICELLA VIRUS VACCINE LIVE 1350 PFU/0.5ML IJ SUSR
0.5000 mL | Freq: Once | INTRAMUSCULAR | Status: AC
  Administered 2019-08-24: 0.5 mL via SUBCUTANEOUS
  Filled 2019-08-21 (×2): qty 0.5

## 2019-08-21 MED ORDER — SIMETHICONE 80 MG PO CHEW
80.0000 mg | CHEWABLE_TABLET | Freq: Three times a day (TID) | ORAL | Status: DC
  Administered 2019-08-22 – 2019-08-24 (×7): 80 mg via ORAL
  Filled 2019-08-21 (×7): qty 1

## 2019-08-21 MED ORDER — OXYCODONE-ACETAMINOPHEN 5-325 MG PO TABS: 2.0000 | ORAL_TABLET | ORAL | Status: DC | PRN

## 2019-08-21 MED ORDER — BUPIVACAINE IN DEXTROSE 0.75-8.25 % IT SOLN
INTRATHECAL | Status: DC | PRN
  Administered 2019-08-21: 1.6 mL via INTRATHECAL

## 2019-08-21 MED ORDER — PRENATAL MULTIVITAMIN CH: 1.0000 | ORAL_TABLET | Freq: Every day | ORAL | Status: DC

## 2019-08-21 MED ORDER — KETOROLAC TROMETHAMINE 30 MG/ML IJ SOLN
INTRAMUSCULAR | Status: DC | PRN
  Administered 2019-08-21: 30 mg via INTRAVENOUS

## 2019-08-21 MED ORDER — GABAPENTIN 300 MG PO CAPS
300.0000 mg | ORAL_CAPSULE | Freq: Two times a day (BID) | ORAL | Status: DC
  Administered 2019-08-21 – 2019-08-24 (×6): 300 mg via ORAL
  Filled 2019-08-21 (×6): qty 1

## 2019-08-21 MED ORDER — ONDANSETRON HCL 4 MG/2ML IJ SOLN
INTRAMUSCULAR | Status: DC | PRN
  Administered 2019-08-21: 4 mg via INTRAVENOUS

## 2019-08-21 MED ORDER — VARICELLA VIRUS VACCINE LIVE 1350 PFU/0.5ML IJ SUSR
0.5000 mL | Freq: Once | INTRAMUSCULAR | Status: DC
  Filled 2019-08-21: qty 0.5

## 2019-08-21 MED ORDER — NALBUPHINE HCL 10 MG/ML IJ SOLN: 5.0000 mg | Freq: Once | INTRAMUSCULAR | Status: DC | PRN

## 2019-08-21 MED ORDER — CEFAZOLIN SODIUM-DEXTROSE 2-3 GM-%(50ML) IV SOLR
INTRAVENOUS | Status: DC | PRN
  Administered 2019-08-21: 2 g via INTRAVENOUS

## 2019-08-21 MED ORDER — NALOXONE HCL 0.4 MG/ML IJ SOLN: 0.4000 mg | INTRAMUSCULAR | Status: DC | PRN

## 2019-08-21 MED ORDER — KETOROLAC TROMETHAMINE 30 MG/ML IJ SOLN
30.0000 mg | Freq: Once | INTRAMUSCULAR | Status: AC
  Administered 2019-08-21: 30 mg via INTRAVENOUS
  Filled 2019-08-21: qty 1

## 2019-08-21 MED ORDER — METHYLERGONOVINE MALEATE 0.2 MG/ML IJ SOLN
INTRAMUSCULAR | Status: AC
  Filled 2019-08-21: qty 1

## 2019-08-21 MED ORDER — SODIUM CHLORIDE 0.9% FLUSH: 3.0000 mL | INTRAVENOUS | Status: DC | PRN

## 2019-08-21 MED ORDER — FERROUS SULFATE 325 (65 FE) MG PO TABS
325.0000 mg | ORAL_TABLET | Freq: Two times a day (BID) | ORAL | Status: DC
  Administered 2019-08-22 – 2019-08-24 (×5): 325 mg via ORAL
  Filled 2019-08-21 (×5): qty 1

## 2019-08-21 MED ORDER — MEASLES, MUMPS & RUBELLA VAC IJ SOLR
0.5000 mL | Freq: Once | INTRAMUSCULAR | Status: DC
  Filled 2019-08-21: qty 0.5

## 2019-08-21 MED ORDER — PROPOFOL 10 MG/ML IV BOLUS
INTRAVENOUS | Status: AC
  Filled 2019-08-21: qty 20

## 2019-08-21 MED ORDER — MORPHINE SULFATE (PF) 0.5 MG/ML IJ SOLN
INTRAMUSCULAR | Status: DC | PRN
  Administered 2019-08-21: .1 mg via EPIDURAL

## 2019-08-21 MED ORDER — DIBUCAINE (PERIANAL) 1 % EX OINT: 1.0000 "application " | TOPICAL_OINTMENT | CUTANEOUS | Status: DC | PRN

## 2019-08-21 MED ORDER — SOD CITRATE-CITRIC ACID 500-334 MG/5ML PO SOLN
ORAL | Status: AC
  Filled 2019-08-21: qty 30

## 2019-08-21 MED ORDER — SIMETHICONE 80 MG PO CHEW: 80.0000 mg | CHEWABLE_TABLET | ORAL | Status: DC | PRN

## 2019-08-21 MED ORDER — DIPHENHYDRAMINE HCL 25 MG PO CAPS: 25.0000 mg | ORAL_CAPSULE | ORAL | Status: DC | PRN

## 2019-08-21 MED ORDER — ZOLPIDEM TARTRATE 5 MG PO TABS: 5.0000 mg | ORAL_TABLET | Freq: Every evening | ORAL | Status: DC | PRN

## 2019-08-21 MED ORDER — NALBUPHINE HCL 10 MG/ML IJ SOLN: 5.0000 mg | INTRAMUSCULAR | Status: DC | PRN

## 2019-08-21 MED ORDER — EPHEDRINE SULFATE 50 MG/ML IJ SOLN
INTRAMUSCULAR | Status: DC | PRN
  Administered 2019-08-21: 5 mg via INTRAVENOUS

## 2019-08-21 MED ORDER — SCOPOLAMINE 1 MG/3DAYS TD PT72: 1.0000 | MEDICATED_PATCH | Freq: Once | TRANSDERMAL | Status: DC

## 2019-08-21 MED ORDER — COCONUT OIL OIL
1.0000 "application " | TOPICAL_OIL | Status: DC | PRN
  Filled 2019-08-21: qty 120

## 2019-08-21 MED ORDER — SODIUM CHLORIDE 0.9 % IV SOLN
INTRAVENOUS | Status: DC | PRN
  Administered 2019-08-21: 50 ug/min via INTRAVENOUS

## 2019-08-21 MED ORDER — WITCH HAZEL-GLYCERIN EX PADS: 1.0000 "application " | MEDICATED_PAD | CUTANEOUS | Status: DC | PRN

## 2019-08-21 MED ORDER — NALOXONE HCL 4 MG/10ML IJ SOLN
1.0000 ug/kg/h | INTRAVENOUS | Status: DC | PRN
  Filled 2019-08-21: qty 5

## 2019-08-21 MED ORDER — IBUPROFEN 800 MG PO TABS
800.0000 mg | ORAL_TABLET | Freq: Four times a day (QID) | ORAL | Status: DC
  Administered 2019-08-22 – 2019-08-24 (×7): 800 mg via ORAL
  Filled 2019-08-21 (×7): qty 1

## 2019-08-21 MED ORDER — FENTANYL CITRATE (PF) 100 MCG/2ML IJ SOLN: 25.0000 ug | INTRAMUSCULAR | Status: DC | PRN

## 2019-08-21 MED ORDER — LACTATED RINGERS IV BOLUS
1000.0000 mL | Freq: Once | INTRAVENOUS | Status: AC
  Administered 2019-08-21: 1000 mL via INTRAVENOUS

## 2019-08-21 MED ORDER — FENTANYL CITRATE (PF) 100 MCG/2ML IJ SOLN
INTRAMUSCULAR | Status: AC
  Administered 2019-08-21: 25 ug
  Filled 2019-08-21: qty 2

## 2019-08-21 MED ORDER — MENTHOL 3 MG MT LOZG
1.0000 | LOZENGE | OROMUCOSAL | Status: DC | PRN
  Filled 2019-08-21: qty 9

## 2019-08-21 MED ORDER — TRAMADOL HCL 50 MG PO TABS
50.0000 mg | ORAL_TABLET | Freq: Four times a day (QID) | ORAL | Status: DC | PRN
  Administered 2019-08-23: 50 mg via ORAL
  Filled 2019-08-21: qty 1

## 2019-08-21 MED ORDER — FENTANYL CITRATE (PF) 100 MCG/2ML IJ SOLN
INTRAMUSCULAR | Status: DC | PRN
  Administered 2019-08-21: 85 ug via INTRAVENOUS
  Administered 2019-08-21: 15 ug via INTRAVENOUS

## 2019-08-21 MED ORDER — ONDANSETRON HCL 4 MG/2ML IJ SOLN: 4.0000 mg | Freq: Three times a day (TID) | INTRAMUSCULAR | Status: DC | PRN

## 2019-08-21 MED ORDER — EPHEDRINE 5 MG/ML INJ
INTRAVENOUS | Status: AC
  Filled 2019-08-21: qty 10

## 2019-08-21 MED ORDER — PRENATAL MULTIVITAMIN CH
1.0000 | ORAL_TABLET | Freq: Every day | ORAL | Status: DC
  Administered 2019-08-22 – 2019-08-24 (×3): 1 via ORAL
  Filled 2019-08-21 (×3): qty 1

## 2019-08-21 MED ORDER — TETANUS-DIPHTH-ACELL PERTUSSIS 5-2.5-18.5 LF-MCG/0.5 IM SUSP: 0.5000 mL | Freq: Once | INTRAMUSCULAR | Status: DC

## 2019-08-21 MED ORDER — MEASLES, MUMPS & RUBELLA VAC IJ SOLR
0.5000 mL | Freq: Once | INTRAMUSCULAR | Status: AC
  Administered 2019-08-24: 0.5 mL via SUBCUTANEOUS
  Filled 2019-08-21 (×2): qty 0.5

## 2019-08-21 MED ORDER — OXYTOCIN-SODIUM CHLORIDE 30-0.9 UT/500ML-% IV SOLN: 2.5000 [IU]/h | INTRAVENOUS | Status: AC

## 2019-08-21 MED ORDER — FERROUS SULFATE 325 (65 FE) MG PO TABS: 325.0000 mg | ORAL_TABLET | Freq: Two times a day (BID) | ORAL | Status: DC

## 2019-08-21 MED ORDER — BETAMETHASONE SOD PHOS & ACET 6 (3-3) MG/ML IJ SUSP
12.0000 mg | Freq: Once | INTRAMUSCULAR | Status: AC
  Administered 2019-08-21: 12 mg via INTRAMUSCULAR
  Filled 2019-08-21: qty 5

## 2019-08-21 MED ORDER — DIPHENHYDRAMINE HCL 25 MG PO CAPS: 25.0000 mg | ORAL_CAPSULE | Freq: Four times a day (QID) | ORAL | Status: DC | PRN

## 2019-08-21 MED ORDER — SODIUM CHLORIDE (PF) 0.9 % IJ SOLN
INTRAMUSCULAR | Status: AC
  Filled 2019-08-21: qty 50

## 2019-08-21 MED ORDER — BUPIVACAINE LIPOSOME 1.3 % IJ SUSP
INTRAMUSCULAR | Status: AC
  Filled 2019-08-21: qty 20

## 2019-08-21 MED ORDER — ACETAMINOPHEN 500 MG PO TABS
1000.0000 mg | ORAL_TABLET | Freq: Four times a day (QID) | ORAL | Status: AC
  Administered 2019-08-21 – 2019-08-22 (×3): 1000 mg via ORAL
  Filled 2019-08-21 (×3): qty 2

## 2019-08-21 MED ORDER — ONDANSETRON HCL 4 MG/2ML IJ SOLN: 4.0000 mg | Freq: Once | INTRAMUSCULAR | Status: DC | PRN

## 2019-08-21 MED ORDER — MORPHINE SULFATE (PF) 0.5 MG/ML IJ SOLN
INTRAMUSCULAR | Status: AC
  Filled 2019-08-21: qty 10

## 2019-08-21 MED ORDER — COCONUT OIL OIL: 1.0000 "application " | TOPICAL_OIL | Status: DC | PRN

## 2019-08-21 MED ORDER — MEPERIDINE HCL 25 MG/ML IJ SOLN: 6.2500 mg | INTRAMUSCULAR | Status: DC | PRN

## 2019-08-21 MED ORDER — OXYTOCIN-SODIUM CHLORIDE 30-0.9 UT/500ML-% IV SOLN
INTRAVENOUS | Status: AC
  Filled 2019-08-21: qty 500

## 2019-08-21 MED ORDER — BUPIVACAINE HCL (PF) 0.5 % IJ SOLN
INTRAMUSCULAR | Status: AC
  Filled 2019-08-21: qty 30

## 2019-08-21 MED ORDER — SIMETHICONE 80 MG PO CHEW
80.0000 mg | CHEWABLE_TABLET | ORAL | Status: DC
  Administered 2019-08-21 – 2019-08-24 (×3): 80 mg via ORAL
  Filled 2019-08-21 (×3): qty 1

## 2019-08-21 MED ORDER — KETOROLAC TROMETHAMINE 30 MG/ML IJ SOLN
30.0000 mg | Freq: Four times a day (QID) | INTRAMUSCULAR | Status: AC
  Administered 2019-08-22 (×3): 30 mg via INTRAVENOUS
  Filled 2019-08-21 (×3): qty 1

## 2019-08-21 MED ORDER — FENTANYL CITRATE (PF) 100 MCG/2ML IJ SOLN
INTRAMUSCULAR | Status: AC
  Filled 2019-08-21: qty 2

## 2019-08-21 MED ORDER — MISOPROSTOL 200 MCG PO TABS
ORAL_TABLET | ORAL | Status: AC
  Filled 2019-08-21: qty 5

## 2019-08-21 MED ORDER — SENNOSIDES-DOCUSATE SODIUM 8.6-50 MG PO TABS
2.0000 | ORAL_TABLET | ORAL | Status: DC
  Administered 2019-08-21 – 2019-08-22 (×2): 2 via ORAL
  Filled 2019-08-21 (×2): qty 2

## 2019-08-21 MED ORDER — KETOROLAC TROMETHAMINE 30 MG/ML IJ SOLN
INTRAMUSCULAR | Status: AC
  Filled 2019-08-21: qty 1

## 2019-08-21 MED ORDER — HYDROMORPHONE HCL 1 MG/ML IJ SOLN: 1.0000 mg | INTRAMUSCULAR | Status: DC | PRN

## 2019-08-21 MED ORDER — LACTATED RINGERS IV SOLN: INTRAVENOUS | Status: DC | PRN

## 2019-08-21 MED ORDER — KETOROLAC TROMETHAMINE 30 MG/ML IJ SOLN: 30.0000 mg | Freq: Four times a day (QID) | INTRAMUSCULAR | Status: DC

## 2019-08-21 MED ORDER — TERBUTALINE SULFATE 1 MG/ML IJ SOLN
0.2500 mg | Freq: Once | INTRAMUSCULAR | Status: AC
  Administered 2019-08-21: 0.25 mg via SUBCUTANEOUS
  Filled 2019-08-21: qty 1

## 2019-08-21 MED ORDER — SODIUM CHLORIDE FLUSH 0.9 % IV SOLN
INTRAVENOUS | Status: DC | PRN
  Administered 2019-08-21: 100 mL

## 2019-08-21 MED ORDER — DIPHENHYDRAMINE HCL 50 MG/ML IJ SOLN: 12.5000 mg | INTRAMUSCULAR | Status: DC | PRN

## 2019-08-21 MED ORDER — OXYTOCIN-SODIUM CHLORIDE 30-0.9 UT/500ML-% IV SOLN
INTRAVENOUS | Status: DC | PRN
  Administered 2019-08-21 (×2): 400 mL via INTRAVENOUS

## 2019-08-21 MED ORDER — LACTATED RINGERS IV SOLN: INTRAVENOUS | Status: DC

## 2019-08-21 SURGICAL SUPPLY — 33 items
BAG COUNTER SPONGE EZ (MISCELLANEOUS) ×2 IMPLANT
BENZOIN TINCTURE PRP APPL 2/3 (GAUZE/BANDAGES/DRESSINGS) ×3 IMPLANT
CANISTER SUCT 3000ML PPV (MISCELLANEOUS) ×3 IMPLANT
CHLORAPREP W/TINT 26 (MISCELLANEOUS) ×6 IMPLANT
CLOSURE WOUND 1/2 X4 (GAUZE/BANDAGES/DRESSINGS) ×1
COUNTER SPONGE BAG EZ (MISCELLANEOUS) ×1
COVER WAND RF STERILE (DRAPES) ×3 IMPLANT
DRAPE C SECTION CLR SCREEN (DRAPES) ×3 IMPLANT
DRSG TELFA 3X8 NADH (GAUZE/BANDAGES/DRESSINGS) ×3 IMPLANT
ELECT REM PT RETURN 9FT ADLT (ELECTROSURGICAL) ×3
ELECTRODE REM PT RTRN 9FT ADLT (ELECTROSURGICAL) ×1 IMPLANT
EXTRT SYSTEM ALEXIS 17CM (MISCELLANEOUS)
GAUZE SPONGE 4X4 12PLY STRL (GAUZE/BANDAGES/DRESSINGS) ×3 IMPLANT
GLOVE BIO SURGEON STRL SZ 6.5 (GLOVE) ×2 IMPLANT
GLOVE BIO SURGEONS STRL SZ 6.5 (GLOVE) ×1
GLOVE INDICATOR 7.0 STRL GRN (GLOVE) ×3 IMPLANT
GOWN STRL REUS W/ TWL LRG LVL3 (GOWN DISPOSABLE) ×2 IMPLANT
GOWN STRL REUS W/TWL LRG LVL3 (GOWN DISPOSABLE) ×4
KIT TURNOVER KIT A (KITS) ×3 IMPLANT
NS IRRIG 1000ML POUR BTL (IV SOLUTION) ×3 IMPLANT
PACK C SECTION (MISCELLANEOUS) ×3 IMPLANT
PAD OB MATERNITY 4.3X12.25 (PERSONAL CARE ITEMS) ×3 IMPLANT
PAD PREP 24X41 OB/GYN DISP (PERSONAL CARE ITEMS) ×3 IMPLANT
PENCIL SMOKE ULTRAEVAC 22 CON (MISCELLANEOUS) ×3 IMPLANT
RETRACTOR WND ALEXIS-O 25 LRG (MISCELLANEOUS) ×1 IMPLANT
RTRCTR WOUND ALEXIS O 25CM LRG (MISCELLANEOUS) ×3
STRIP CLOSURE SKIN 1/2X4 (GAUZE/BANDAGES/DRESSINGS) ×2 IMPLANT
SUT MNCRL AB 4-0 PS2 18 (SUTURE) ×3 IMPLANT
SUT PLAIN 2 0 XLH (SUTURE) IMPLANT
SUT VIC AB 0 CT1 36 (SUTURE) ×12 IMPLANT
SUT VIC AB 3-0 SH 27 (SUTURE) ×2
SUT VIC AB 3-0 SH 27X BRD (SUTURE) ×1 IMPLANT
SYSTEM CONTND EXTRCTN KII BLLN (MISCELLANEOUS) IMPLANT

## 2019-08-21 NOTE — Progress Notes (Signed)
RN with pt to SCN to visit baby. Pt transferred to [facility] and report given to [facility]. Pt stable and denies any further needs at this time.

## 2019-08-21 NOTE — Op Note (Signed)
Cesarean Section Procedure Note  Indications: abruptio placenta  Pre-operative Diagnosis: 33 week 1 day pregnancy, placental abruption, limited prenatal care.  Post-operative Diagnosis: Same  Surgeon: Hildred Laser, MD  Assistants:   Serafina Royals, CNM.  An experienced assistant was required given the standard of surgical care given the complexity of the case.  This assistant was needed for exposure, dissection, suctioning, retraction, instrument exchange, and for overall help during the procedure.  Procedure: Primary (urgent) low transverse Cesarean Section  Anesthesia: Spinal anesthesia  Procedure Details: The patient was seen in the Holding Room. The risks, benefits, complications, treatment options, and expected outcomes were discussed with the patient.  The patient concurred with the proposed plan, giving informed consent.  The site of surgery properly noted/marked. The patient was taken to the Operating Room, identified as Abigail Curry and the procedure verified as C-Section Delivery. A Time Out was held and the above information confirmed.  After induction of anesthesia, the patient was draped and prepped in the usual sterile manner. Anesthesia was tested and noted to be adequate. A Pfannenstiel incision was made and carried down through the subcutaneous tissue to the fascia. Fascial incision was made and extended transversely. The fascia was separated from the underlying rectus tissue superiorly and inferiorly. The peritoneum was identified and entered. Peritoneal incision was extended longitudinally. The surgical assist was able to provide retraction to allow for clear visualization of surgical site. The utero-vesical peritoneal reflection was incised transversely and the bladder flap was bluntly freed from the lower uterine segment. The uterus was noted to be moderately rotated, ~ 70-90 degrees in rotation).  A low transverse uterine incision was made. Amniotomy was performed  using an Allice clamp, with blood tinged fluid present. Delivered from cephalic presentation was a 1570 gram Female with Apgar scores of 5 at one minute and 8 at five minutes. A loose nuchal cord was reduced after delivery of the infant. The assistant was able to apply adequate fundal pressure to allow for successful delivery of the fetus. After the umbilical cord was clamped and cut, cord blood was obtained for evaluation. The placenta was removed intact, with approximately 80% of the placenta covered in old clots. The uterus was unable to be exteriorized due to patient discomfort, but was cleared of all clots and debris. The uterine incision was closed with a running locked suture of 0-Vicryl.  A second suture of 0-Vicryl was used in an imbricating layer.  Hemostasis was observed.The gutters were cleared of all clots and debris.  The peritoneum was then reapproximated with a running suture of 1-0 Vicryl.  The muscle layer was approximated with a figure-of-eight suture of 1-0 Vicryl.  The fascia was then reapproximated with a running suture of 1-0 Vicryl. The fascia was injected with Exparel solution.  The skin was reapproximated with 4-0 Monocryl and also injected with Exparel solution.  A total of 100 ml of Exparel solution.  Instrument, sponge, and needle counts were correct prior the abdominal closure and at the conclusion of the case.   Findings: Female infant, cephalic presentation, 1570 grams, with Apgar scores of 5 at one minute and 8 at five minutes. Intact placenta with 3 vessel cord. Loose nuchal cord. Adherent blood clots to the placenta noting to cover ~ 80% of the maternal surface. Blood-tinged amniotic fluid at amniotomy The left fallopian tube and ovary was able to be visualized. The right fallopian tube and ovary could not be visualized as we were unable to exteriorize the uterus due  to patient discomfort, and also significant rotation of the uterus.    Estimated Blood Loss:  425 ml       Drains: foley catheter to gravity drainage, 50 ml of clear urine at end of the procedure         Total IV Fluids:  600 ml  Specimens: Placenta and Disposition:  Sent to Pathology         Implants: None         Complications:  None; patient tolerated the procedure well.         Disposition: PACU - hemodynamically stable.         Condition: stable   Hildred Laser, MD [facility]

## 2019-08-21 NOTE — Telephone Encounter (Signed)
Patient's friend called in stating that the patient thinks she lost her mucus plug. Patient states she is having sharp pains and feels like she has to defecate. Patient states light blood when the mucus plug came out. Patient would like to know if she needs to go to the emergency room. Patient denies her water breaking or any contractions. Could you please advise.  Patient requested we call cell phone number [phone].

## 2019-08-21 NOTE — Progress Notes (Signed)
RN notified provider Lawhorn, CNM of patient having recurrent late declarations on fetal monitor with minimal variability. RN also request provider to look over ultrasound photos. Will continue to monitor.

## 2019-08-21 NOTE — Telephone Encounter (Signed)
Please advise patient to go the birthing suites at [facility] for further evaluation. Thanks, JML

## 2019-08-21 NOTE — Telephone Encounter (Signed)
Spoke with friend- Evern Bio- advised her of Claretta Fraise request to go to birthing suites at [facility]. Ms Anner Crete understood and will relay the message to the patient.

## 2019-08-21 NOTE — Progress Notes (Signed)
Dr. Eppie Gibson confirmed through ultrasound tech that the patient is abrupting. RN to inform provider Jeralyn Bennett, CNM.

## 2019-08-21 NOTE — Telephone Encounter (Signed)
Patient is hearing impaired- spoke with patients friend- she states the patient is having cramps but nothing regular. She also thinks she lost part of her mucous plug. Encouraged patient to stay hydrated and if the cramps become more regular, more frequent, more intense and last longer to please reach out to [place]. The friend stated they were supposed to go to the beach tomorrow and asked if this was a good idea. Told the friend a message would be sent to Serafina Royals CNM and we would get back in touch with her.

## 2019-08-21 NOTE — Progress Notes (Signed)
RN spoke with Dr. Valentino Saxon who gave the go-ahead to call urgent c-section for placental abruption.

## 2019-08-21 NOTE — H&P (Signed)
Obstetric History and Physical  Abigail Curry is a 24 y.o. G3P1011 with IUP at [redacted]w[redacted]d presenting with sharp lower abdominal pain and bright red vaginal bleeding x two (2) episodes, 0600 and 0800; found to have a placenta abruption.   Patient states she has been having irregular contractions, minimal vaginal bleeding, ruptured membranes, with active fetal movement.    Denies difficulty breathing or respiratory distress, chest pain, dysuria, and leg pain or swelling  Prenatal Course  Source of Care: [facility]-initial visit at 18 weeks, total visits: 2  Pregnancy complications or risks: Placental abruption, Vaginal bleeding during pregnancy, Varicella non-immune, Rubella non-immune  Prenatal labs and studies:  ABO, Rh: --/--/O POS (07/02 1254)  Antibody: NEG (07/02 1254)  Rubella: <0.90 (03/18 1045)  RPR: Non Reactive (06/23 0910)   HBsAg: Negative (03/18 1045)   HIV: Non Reactive (03/18 1045)   QZR:AQTMAUQJ/-- (07/02 1254)  1 hr Glucola: 126 (06/23 0910)  Genetic screening: Declined  Anatomy US: Ordered, no completed  No past medical history on file.  Past Surgical History:  Procedure Laterality Date  . COCHLEAR IMPLANT    . DIAGNOSTIC LAPAROSCOPY WITH REMOVAL OF ECTOPIC PREGNANCY Left 05/10/2018   Procedure: DIAGNOSTIC LAPAROSCOPY WITH REMOVAL OF ECTOPIC PREGNANCY;  Surgeon: Christeen Douglas, MD;  Location: [facility] ORS;  Service: Gynecology;  Laterality: Left;    OB History  Gravida Para Term Preterm AB Living  3 1 1   1 1   SAB TAB Ectopic Multiple Live Births      1   1    # Outcome Date GA Lbr Len/2nd Weight Sex Delivery Anes PTL Lv  3 Current           2 Term 09/05/17    M Vag-Spont EPI N LIV  1 Ectopic             Social History   Socioeconomic History  . Marital status: Single    Spouse name: Not on file  . Number of children: Not on file  . Years of education: Not on file  . Highest education level: Not on file  Occupational History  . Not on file   Tobacco Use  . Smoking status: Never Smoker  . Smokeless tobacco: Never Used  Vaping Use  . Vaping Use: Never used  Substance and Sexual Activity  . Alcohol use: Not Currently  . Drug use: Not Currently  . Sexual activity: Not Currently    Birth control/protection: None  Other Topics Concern  . Not on file  Social History Narrative  . Not on file   Social Determinants of Health   Financial Resource Strain:   . Difficulty of Paying Living Expenses:   Food Insecurity:   . Worried About 09/07/17 in the Last Year:   . Programme researcher, broadcasting/film/video in the Last Year:   Transportation Needs:   . Barista (Medical):   Freight forwarder Lack of Transportation (Non-Medical):   Physical Activity:   . Days of Exercise per Week:   . Minutes of Exercise per Session:   Stress:   . Feeling of Stress :   Social Connections:   . Frequency of Communication with Friends and Family:   . Frequency of Social Gatherings with Friends and Family:   . Attends Religious Services:   . Active Member of Clubs or Organizations:   . Attends [employer] Meetings:   Banker Marital Status:     Family History  Problem Relation Age of Onset  .  Diabetes Mother   . Sickle cell anemia Brother   . Breast cancer Neg Hx   . Ovarian cancer Neg Hx   . Colon cancer Neg Hx     Medications Prior to Admission  Medication Sig Dispense Refill Last Dose  . Prenatal Vit-Fe Fumarate-FA (PRENATAL MULTIVITAMIN) TABS tablet Take 1 tablet by mouth daily at 12 noon.   08/21/2019 at Unknown time    No Known Allergies  Review of Systems: Negative except for what is mentioned in HPI.  Physical Exam:  Temp:  [97.8 F (36.6 C)-98.4 F (36.9 C)] 97.8 F (36.6 C) (07/02 1641) Pulse Rate:  [59-64] 64 (07/02 1641) Resp:  [15-16] 15 (07/02 1641) BP: (128-150)/(78-98) 150/98 (07/02 1641) Weight:  [92.5 kg] 92.5 kg (07/02 1232)  GENERAL: Well-developed, well-nourished female in no acute distress.   LUNGS: Clear to  auscultation bilaterally.   HEART: Regular rate and rhythm.  ABDOMEN: Soft, nontender, nondistended, gravid.  EXTREMITIES: Nontender, no edema, 2+ distal pulses.  Cervical Exam: Dilation: 1 Effacement (%): 50 Cervical Position: Posterior Station: -3 Presentation: Vertex Exam by:: Laural Benes RN  FHT: Category II  Contractions: Irregular, soft resting tone   Pertinent Labs/Studies:   Results for orders placed or performed during the hospital encounter of 08/21/19 (from the past 24 hour(s))  ROM Plus ([facility] only)     Status: None   Collection Time: 08/21/19 12:54 PM  Result Value Ref Range   Rom Plus POSITIVE   CBC     Status: Abnormal   Collection Time: 08/21/19 12:54 PM  Result Value Ref Range   WBC 18.4 (H) 4.0 - 10.5 K/uL   RBC 4.21 3.87 - 5.11 MIL/uL   Hemoglobin 11.9 (L) 12.0 - 15.0 g/dL   HCT 49.6 (L) 36 - 46 %   MCV 82.9 80.0 - 100.0 fL   MCH 28.3 26.0 - 34.0 pg   MCHC 34.1 30.0 - 36.0 g/dL   RDW 75.9 (H) 16.3 - 84.6 %   Platelets 322 150 - 400 K/uL   nRBC 0.0 0.0 - 0.2 %  Type and screen     Status: None   Collection Time: 08/21/19 12:54 PM  Result Value Ref Range   ABO/RH(D) O POS    Antibody Screen NEG    Sample Expiration      08/24/2019,2359 Performed at [facility], [address] [place], [place] [address]   Wet prep, genital     Status: Abnormal   Collection Time: 08/21/19 12:54 PM  Result Value Ref Range   Yeast Wet Prep HPF POC NONE SEEN NONE SEEN   Trich, Wet Prep NONE SEEN NONE SEEN   Clue Cells Wet Prep HPF POC NONE SEEN NONE SEEN   WBC, Wet Prep HPF POC MANY (A) NONE SEEN   Sperm NONE SEEN   Group B strep by PCR     Status: None   Collection Time: 08/21/19 12:54 PM   Specimen: Genital  Result Value Ref Range   Group B strep by PCR NEGATIVE NEGATIVE  Urine Drug Screen, Qualitative ([facility] only)     Status: Abnormal   Collection Time: 08/21/19  1:01 PM  Result Value Ref Range   Tricyclic, Ur Screen NONE DETECTED NONE DETECTED    Amphetamines, Ur Screen NONE DETECTED NONE DETECTED   MDMA (Ecstasy)Ur Screen NONE DETECTED NONE DETECTED   Cocaine Metabolite,Ur [place] NONE DETECTED NONE DETECTED   Opiate, Ur Screen NONE DETECTED NONE DETECTED   Phencyclidine (PCP) Ur S NONE  DETECTED NONE DETECTED   Cannabinoid 50 Ng, Ur [place] POSITIVE (A) NONE DETECTED   Barbiturates, Ur Screen NONE DETECTED NONE DETECTED   Benzodiazepine, Ur Scrn NONE DETECTED NONE DETECTED   Methadone Scn, Ur NONE DETECTED NONE DETECTED  SARS Coronavirus 2 by RT PCR (hospital order, performed in [facility] hospital lab) Nasopharyngeal Nasopharyngeal Swab     Status: None   Collection Time: 08/21/19  1:01 PM   Specimen: Nasopharyngeal Swab  Result Value Ref Range   SARS Coronavirus 2 NEGATIVE NEGATIVE  Urinalysis, Complete w Microscopic     Status: Abnormal   Collection Time: 08/21/19  1:04 PM  Result Value Ref Range   Color, Urine STRAW (A) YELLOW   APPearance CLEAR (A) CLEAR   Specific Gravity, Urine 1.004 (L) 1.005 - 1.030   pH 7.0 5.0 - 8.0   Glucose, UA NEGATIVE NEGATIVE mg/dL   Hgb urine dipstick SMALL (A) NEGATIVE   Bilirubin Urine NEGATIVE NEGATIVE   Ketones, ur NEGATIVE NEGATIVE mg/dL   Protein, ur NEGATIVE NEGATIVE mg/dL   Nitrite NEGATIVE NEGATIVE   Leukocytes,Ua NEGATIVE NEGATIVE   RBC / HPF 0-5 0 - 5 RBC/hpf   WBC, UA 0-5 0 - 5 WBC/hpf   Bacteria, UA NONE SEEN NONE SEEN   Squamous Epithelial / LPF 0-5 0 - 5    EXAM: LIMITED OBSTETRIC ULTRASOUND  FINDINGS: Number of Fetuses: 1  Heart Rate:  137 bpm  Movement: No  Presentation: Cephalic  Placental Location: Anterior  Previa: No  Amniotic Fluid (Subjective):  Within normal limits.  AFI: 9.5 cm  BPD: 7.9 cm 31 w  6 d  MATERNAL FINDINGS:  Cervix:  Appears closed.  Measures 4.0 cm in length TA.  Uterus/Adnexae: A large retro-placental abruption is seen anteriorly measuring at least 8.0 x 4.3 x 8.8 cm.  IMPRESSION: Single living intrauterine fetus in  cephalic presentation.  Large anterior retro-placental abruption. Critical Value/emergent results were called by telephone at the time of interpretation on 08/21/2019 at 2:10 Pm to provider [facility] , who verbally acknowledged these results.  This exam is performed on an emergent basis and does not comprehensively evaluate fetal size, dating, or anatomy; follow-up complete OB US should be considered if further fetal assessment is warranted.  Assessment : Abigail Curry is a 24 y.o. G3P1011 at [redacted]w[redacted]d being admitted for placental abruption, , Vaginal bleeding during pregnancy, Varicella non-immune, Rubella non-immune  FHR Category II  Plan:  Admit to birthing suites, see orders.   Dr. Valentino Saxon notified of FHR tracing.Terbutaline and Betamethasone given, see orders. Advised to monitor for 30 minutes and proceed with surgical birth of fetal heart rate decelerations continue.   Plan of care reviewed with patient, verbalized understanding.   Consent obtained for primary cesarean section due to placenta abruption along with risks and benefits of procedure.   Continue orders as written. Reassess as needed.   Neonatology consult ordered and at bedside.    Serafina Royals, CNM [facility], [facility] 08/21/19 5:22 PM

## 2019-08-21 NOTE — Progress Notes (Signed)
RN contacted anesthesia for pain med orders given that patient's pain was 7/10. Verbal order to give 12.5-25 mcg of fentanyl per Dr. Pernell Dupre MD.

## 2019-08-21 NOTE — Transfer of Care (Signed)
Immediate Anesthesia Transfer of Care Note  Patient: Abigail Curry  Procedure(s) Performed: CESAREAN SECTION  Patient Location: OB High Risk  Anesthesia Type:Spinal  Level of Consciousness: drowsy  Airway & Oxygen Therapy: Patient Spontanous Breathing  Post-op Assessment: Report given to RN and Post -op Vital signs reviewed and stable  Post vital signs: Reviewed and stable  Last Vitals:  Vitals Value Taken Time  BP 150/98 08/21/19 1641  Temp 36.6 C 08/21/19 1641  Pulse 64 08/21/19 1641  Resp 15 08/21/19 1641  SpO2 100 %     Last Pain:  Vitals:   08/21/19 1641  TempSrc: Oral  PainSc:       Patients Stated Pain Goal: 0 (08/21/19 1233)  Complications: No complications documented.

## 2019-08-21 NOTE — Anesthesia Procedure Notes (Signed)
Date/Time: 08/21/2019 3:22 PM Performed by: Junious Silk, CRNA Pre-anesthesia Checklist: Patient identified, Emergency Drugs available, Suction available, Patient being monitored and Timeout performed Oxygen Delivery Method: Nasal cannula

## 2019-08-21 NOTE — Progress Notes (Signed)
RN spoke with provider and given verbal orders to start IV fluid bolus. Provider en route to L&D. Will continue to monitor.

## 2019-08-21 NOTE — OB Triage Note (Signed)
Pt presents c/o sharp lower abdominal pain at 6:00 this  morning. Pt states she had some moderate bright red vaginal bleeding then and again at 8:00 am. Pt states she has been going to the bathroom a lot and had some lower back pain too. Pt states she could be better about drinking water. Pt stated she had a lot of soda yesterday. Pt also states her most recent intercourse was about a week ago. PT denies LOF. Reports positive fetal movement. Vitals WNL. Will continue to monitor.

## 2019-08-21 NOTE — Anesthesia Preprocedure Evaluation (Signed)
Anesthesia Evaluation  Patient identified by MRN, date of birth, ID band Patient awake    Reviewed: Allergy & Precautions, H&P , NPO status , Patient's Chart, lab work & pertinent test results  Airway Mallampati: I  TM Distance: >3 FB Neck ROM: full    Dental  (+) Chipped   Pulmonary neg pulmonary ROS,           Cardiovascular Exercise Tolerance: Good (-) hypertensionnegative cardio ROS       Neuro/Psych    GI/Hepatic negative GI ROS,   Endo/Other    Renal/GU   negative genitourinary   Musculoskeletal   Abdominal   Peds  Hematology negative hematology ROS (+)   Anesthesia Other Findings Placental abruption Deaf, has cochlear implant and reads lips  Past Surgical History: No date: COCHLEAR IMPLANT 05/10/2018: DIAGNOSTIC LAPAROSCOPY WITH REMOVAL OF ECTOPIC PREGNANCY;  Left     Comment:  Procedure: DIAGNOSTIC LAPAROSCOPY WITH REMOVAL OF               ECTOPIC PREGNANCY;  Surgeon: Christeen Douglas, MD;                Location: [facility] ORS;  Service: Gynecology;  Laterality:               Left;  BMI    Body Mass Index: 31.95 kg/m      Reproductive/Obstetrics (+) Pregnancy                             Anesthesia Physical Anesthesia Plan  ASA: III  Anesthesia Plan: Spinal   Post-op Pain Management:    Induction:   PONV Risk Score and Plan:   Airway Management Planned:   Additional Equipment:   Intra-op Plan:   Post-operative Plan:   Informed Consent: I have reviewed the patients History and Physical, chart, labs and discussed the procedure including the risks, benefits and alternatives for the proposed anesthesia with the patient or authorized representative who has indicated his/her understanding and acceptance.     Dental Advisory Given  Plan Discussed with: Anesthesiologist  Anesthesia Plan Comments:         Anesthesia Quick Evaluation

## 2019-08-21 NOTE — Anesthesia Procedure Notes (Signed)
Spinal  Patient location during procedure: OR Start time: 08/21/2019 3:10 PM End time: 08/21/2019 3:14 PM Staffing Performed: resident/CRNA  Resident/CRNA: Nelda Marseille, CRNA Preanesthetic Checklist Completed: patient identified, IV checked, site marked, risks and benefits discussed, surgical consent, monitors and equipment checked, pre-op evaluation and timeout performed Spinal Block Patient position: sitting Prep: Betadine Patient monitoring: heart rate, continuous pulse ox, blood pressure and cardiac monitor Approach: midline Location: L3-4 Injection technique: single-shot Needle Needle type: Whitacre and Introducer  Needle gauge: 25 G Needle length: 9 cm Assessment Sensory level: T4 Additional Notes Negative paresthesia. Negative blood return. Positive free-flowing CSF. Expiration date of kit checked and confirmed. Patient tolerated procedure well, without complications.

## 2019-08-22 ENCOUNTER — Encounter: Payer: Self-pay | Admitting: Obstetrics and Gynecology

## 2019-08-22 LAB — URINE CULTURE: Culture: <10000 — AB

## 2019-08-22 LAB — CBC
HCT: 31.1 % — ABNORMAL LOW (ref 36.0–46.0)
Hemoglobin: 10.3 g/dL — ABNORMAL LOW (ref 12.0–15.0)
MCH: 28.3 pg (ref 26.0–34.0)
MCHC: 33.1 g/dL (ref 30.0–36.0)
MCV: 85.4 fL (ref 80.0–100.0)
Platelets: 258 10*3/uL (ref 150–400)
RBC: 3.64 MIL/uL — ABNORMAL LOW (ref 3.87–5.11)
RDW: 16.6 % — ABNORMAL HIGH (ref 11.5–15.5)
WBC: 25.1 10*3/uL — ABNORMAL HIGH (ref 4.0–10.5)
nRBC: 0 % (ref 0.0–0.2)

## 2019-08-22 LAB — CHLAMYDIA/NGC RT PCR (ARMC ONLY)
Chlamydia Tr: DETECTED — AB
N gonorrhoeae: NOT DETECTED

## 2019-08-22 LAB — RPR: RPR Ser Ql: NONREACTIVE

## 2019-08-22 NOTE — Clinical Social Work Maternal (Signed)
CLINICAL SOCIAL WORK MATERNAL/CHILD NOTE  Patient Details  Name: Abigail Curry MRN: [id number] Date of Birth: 06/14/1995  Date:  05-25-2019  Clinical Social Worker Initiating Note:  Madilyn Fireman, MSW, LCSW-A Date/Time: Initiated:  08/22/19/1052     Child's Name:  Abigail Curry   Biological Parents:  Mother   Need for Interpreter:  None   Reason for Referral:  Late or No Prenatal Care    Address:  [address] [place] [address]    Phone number:  [phone] (home)     Additional phone number:   Household Members/Support Persons (HM/SP):   Household Member/Support Person 1   HM/SP Name Relationship DOB or Age  HM/SP -1 Micah Bursch Child 09/05/2017  HM/SP -2        HM/SP -3        HM/SP -4        HM/SP -5        HM/SP -6        HM/SP -7        HM/SP -8          Natural Supports (not living in the home):      Professional Supports:     Employment: Unemployed   Type of Work:     Education:  Programmer, systems   Homebound arranged:    Museum/gallery curator Resources:  Medicaid   Other Resources:  Physicist, medical , Rachel Considerations Which May Impact Care:    Strengths:  Home prepared for child , Engineer, materials, Ability to meet basic needs    Psychotropic Medications:         Pediatrician:    [employer]  Pediatrician List:   Early      Pediatrician Fax Number:    Risk Factors/Current Problems:  None   Cognitive State:  Alert , Able to Concentrate    Mood/Affect:  Calm , Comfortable    CSW Assessment:  CSW received consult for MOB due to limited prenatal care and positive UDS prior to delivery. CSW met with MOB at bedside to complete discussion. MOB is hard of hearing, however MOB able to read lips and successfully complete assessment without an sign language intrepreter. CSW explained to [facility] about  hospital drug screening policies due to limited prenatal care. CSW informed MOB that her urine was positive for marijuana prior to delivery but that the infant's urine was negative. CSW explained mandated reporting laws in regards to substance exposed newborns. MOB stated understanding and stated she had a CPS case opened after her last delivery due to the infant being positive for marijuana at birth. MOB reports her last marijuana use was one month ago. MOB did not have an explanation as to why she only had two prenatal visits.  MOB reports the infant's name is Advertising account executive. MOB reports this is her second child, the oldest being [employer]. MOB reports that Toni Arthurs is with his father. MOB reports the FOB for the newborn is not involved. MOB reports she resides in an apartment with only her children. MOB reports she receives [facility], Medicaid, and food stamps. MOB states she is unemployed and receives an SSI check. MOB reports she is able to maintain her household with her current income.  MOB reports the infant will sleep in a 3-n-1 pack and play at home. CSW and MOB  discussed SIDS and safe sleeping techniques. MOB reports she has chosen a pediatrician for the infant. MOB had an unopened car seat with her in the room - she states she has knowledge of installation and use. MOB reports having all items at home needed for newborn care. MOB reports having a good support system of family and friends. MOB reports that her mother will provide her with transportation home once ready for discharge.   CSW answered all MOB's questions and encouraged her to reach out if assistance is needed. CSW will monitor the infant's chart for cord results and will make a report if necessary.   CSW Plan/Description:  CSW Will Continue to Monitor Umbilical Cord Tissue Drug Screen Results and Make Report if Warranted    Alece Koppel L Ashraf Mesta, LCSW 08/22/2019, 11:00 AM   

## 2019-08-22 NOTE — Anesthesia Post-op Follow-up Note (Signed)
  Anesthesia Pain Follow-up Note  Patient: Abigail Curry  Day #: 1  Date of Follow-up: 08/22/2019 Time: 10:25 AM  Last Vitals:  Vitals:   08/22/19 0737 08/22/19 0759  BP: 107/68   Pulse: 60 60  Resp: 20   Temp: 36.9 C   SpO2: 98%     Level of Consciousness: alert  Pain: mild   Side Effects:None  Catheter Site Exam:clean, dry     Plan: D/C from anesthesia care at surgeon's request  Cleda Mccreedy Abigail Curry

## 2019-08-22 NOTE — Lactation Note (Addendum)
This note was copied from a baby's chart. Lactation Consultation Note  Patient Name: Boy Terri Rorrer [id number] Date: 08/22/2019   Mom's original plan was to bottlefeed formula.  Once her baby was born at 33.1 weeks and was in [facility], she wanted him to get [facility] and agreed to pump.  Symphony DEBP set up in room with instructions in breast massage, hand expression, pumping, collection, storage, labeling, cleaning and handling of expressed milk.  She expressed enough to swab baby's mouth.  Explained supply and demand and encouraged mom to continue to stimulate by pumping 8 to 12 times in 24 hours or every 2 to 3 hours to bring in mature milk and ensure a plentiful milk supply.  Mom has been committed to pumping. Praised mom for pumping to supply her breast milk.  Coconut oil given with instructions in use.  FYI mom has difficulty hearing and has cochlear implants.  Lactation name and number has been written on white board and encouraged to call with any questions, concerns or assistance.  Maternal Data    Feeding Feeding Type: Donor Breast Milk  LATCH Score                   Interventions    Lactation Tools Discussed/Used     Consult Status      Louis Meckel 08/22/2019, 8:44 PM

## 2019-08-22 NOTE — Anesthesia Postprocedure Evaluation (Signed)
Anesthesia Post Note  Patient: Abigail Curry  Procedure(s) Performed: CESAREAN SECTION  Patient location during evaluation: Mother Baby Anesthesia Type: Spinal Level of consciousness: oriented and awake and alert Pain management: pain level controlled Vital Signs Assessment: post-procedure vital signs reviewed and stable Respiratory status: spontaneous breathing and respiratory function stable Cardiovascular status: blood pressure returned to baseline and stable Postop Assessment: no headache, no backache, no apparent nausea or vomiting and able to ambulate Anesthetic complications: no   No complications documented.   Last Vitals:  Vitals:   08/22/19 0737 08/22/19 0759  BP: 107/68   Pulse: 60 60  Resp: 20   Temp: 36.9 C   SpO2: 98%     Last Pain:  Vitals:   08/22/19 0737  TempSrc: Oral  PainSc:                  Cleda Mccreedy Kiah Keay

## 2019-08-22 NOTE — Progress Notes (Signed)
Postpartum Day # 1: Primary Low-Transverse Cesarean Delivery (urgent) at 33.1 weeks for placental abruption  Subjective: Patient reports tolerating PO and no problems voiding.  Has not yet passed gas or had a BM.  Has ambulated a few times within the room.     Objective: Vital signs in last 24 hours: Temp:  [97.7 F (36.5 C)-98.5 F (36.9 C)] 98.5 F (36.9 C) (07/03 0737) Pulse Rate:  [44-64] 60 (07/03 0759) Resp:  [14-24] 20 (07/03 0737) BP: (89-150)/(61-98) 107/68 (07/03 0737) SpO2:  [96 %-99 %] 98 % (07/03 0737) Weight:  [92.5 kg] 92.5 kg (07/02 1232)  Physical Exam:  General: alert and no distress Lungs: clear to auscultation bilaterally Breasts: normal appearance, no masses or tenderness Heart: regular rate and rhythm, S1, S2 normal, no murmur, click, rub or gallop Abdomen: soft, non-tender; bowel sounds normal; no masses,  no organomegaly Pelvis: Lochia appropriate, Uterine Fundus firm, Incision: healing well, no significant drainage, no dehiscence, no significant erythema Extremities: DVT Evaluation: No evidence of DVT seen on physical exam. Negative Homan's sign. No cords or calf tenderness. No significant calf/ankle edema.   Recent Labs    08/21/19 1254 08/22/19 0645  HGB 11.9* 10.3*  HCT 34.9* 31.1*    Results for orders placed or performed during the hospital encounter of 08/21/19  Urine Drug Screen, Qualitative ([facility] only)  Result Value Ref Range   Tricyclic, Ur Screen NONE DETECTED NONE DETECTED   Amphetamines, Ur Screen NONE DETECTED NONE DETECTED   MDMA (Ecstasy)Ur Screen NONE DETECTED NONE DETECTED   Cocaine Metabolite,Ur Starr NONE DETECTED NONE DETECTED   Opiate, Ur Screen NONE DETECTED NONE DETECTED   Phencyclidine (PCP) Ur S NONE DETECTED NONE DETECTED   Cannabinoid 50 Ng, Ur [place] POSITIVE (A) NONE DETECTED   Barbiturates, Ur Screen NONE DETECTED NONE DETECTED   Benzodiazepine, Ur Scrn NONE DETECTED NONE DETECTED   Methadone Scn, Ur NONE DETECTED NONE  DETECTED     Assessment/Plan: Status post Cesarean section. Doing well postoperatively.  Social Work consult for limited [facility], marijuana use in pregnancy, preterm delivery. To encourage MJ cessation.  Circumcision prior to discharge (infant in Special Care Nursery for prematurity currently) Contraception: Depo Provera, to receive prior to discharge.  Formula feeding Regular diet as tolerated Continue PO pain management Mild anemia postpartum, can treat with PO iron supplementation.  Rubella and Varicella non-immune, will vaccinate postpartum.  Patient with noted bradycardia during most of the evening. Is unsure of what her resting heart rate is, denies prior cardiac issues. Will get baseline EKG to assess.  Continue current care.  Hildred Laser, MD [facility]

## 2019-08-23 MED ORDER — MEDROXYPROGESTERONE ACETATE 150 MG/ML IM SUSP
150.0000 mg | Freq: Once | INTRAMUSCULAR | Status: DC
  Filled 2019-08-23: qty 1

## 2019-08-23 NOTE — Lactation Note (Addendum)
This note was copied from a baby's chart. Lactation Consultation Note  Patient Name: Boy Mee Macdonnell [id number] Date: 08/23/2019   Mom is still committed to pumping every 2 to 3 hours and milk is increasing in volume.  Mom denies any breast or nipple pain.  Mom is interested in putting baby to the breast when ready.  Rented Symphony pump through [facility] for a week until can get [facility] loaner pump.  Referral sent to [facility] for mom to get DEBP.  Encoraged mom to call with any questions, concerns or assistance.  Maternal Data    Feeding Feeding Type: Donor Breast Milk  LATCH Score                   Interventions    Lactation Tools Discussed/Used Tools: 25F feeding tube / Syringe   Consult Status      Louis Meckel 08/23/2019, 9:54 PM

## 2019-08-23 NOTE — Progress Notes (Signed)
Patient ID: Abigail Curry, female   DOB: 18-Dec-1995, 24 y.o.   MRN: [id number]  Post Partum Day # 2, s/p primary low transverse cesarean section at 33 weeks for placental abruption, Rh positive, GBS negative, History of marijuana use, Late and limited prenatal care, Rubella and Varicella non-immune, Breastfeeding  Subjective:  Patient in special care nursery skin to skin with infant.   Doing well now, reports pain earlier this morning that was managed with oral medication.   Eating and drinking with positive flatus and stool x 1. Voiding without difficulties.   Using double electric pump to express breast milk in small amounts, being seen by lactation.   Denies difficulty breathing or respiratory distress, chest pain, abdominal pain, excessive vaginal bleeding, dysuria, and leg pain or swelling.   Objective: Temp:  [97.6 F (36.4 C)-98.7 F (37.1 C)] 98.7 F (37.1 C) (07/03 2307) Pulse Rate:  [55-66] 64 (07/03 2307) Resp:  [18-20] 18 (07/03 2307) BP: (109-115)/(62-66) 109/63 (07/03 2307) SpO2:  [97 %-100 %] 99 % (07/03 2307)  Physical Exam: Limited since visiting with newborn  General: alert and cooperative   Lungs: clear to auscultation bilaterally  Heart: normal apical impulse  Extremities: DVT Evaluation: No evidence of DVT seen on physical exam.  Recent Labs    08/21/19 1254 08/22/19 0645  HGB 11.9* 10.3*  HCT 34.9* 31.1*    Assessment:  Post Partum Day # 2, s/p primary low transverse cesarean section at 33 weeks for placental abruption, Rh positive, GBS negative, History of marijuana use, Late and limited prenatal care, Rubella and Varicella non-immune  Breastfeeding  Plan:  Routine postpartum care and education.   Social work consult completed.   Lactation consult as needed.   Nursing to place honeycomb dressing when patient returns to unit.   Continue orders as written. Reassess as needed.   Plan for discharge tomorrow   LOS: 2 days    Gunnar Bulla, CNM [facility] 08/23/2019 10:13 AM

## 2019-08-23 NOTE — Progress Notes (Signed)
Spoke with pt RN about not receiving a call about order of EKG for bradycardia on 08/22/19 and a new  Order would need to be placed and the older order would have to be discontinued.

## 2019-08-24 DIAGNOSIS — O0933 Supervision of pregnancy with insufficient antenatal care, third trimester: Secondary | ICD-10-CM

## 2019-08-24 DIAGNOSIS — O0930 Supervision of pregnancy with insufficient antenatal care, unspecified trimester: Secondary | ICD-10-CM

## 2019-08-24 MED ORDER — GABAPENTIN 300 MG PO CAPS: 300.0000 mg | ORAL_CAPSULE | Freq: Two times a day (BID) | ORAL | 0 refills | Status: DC

## 2019-08-24 MED ORDER — IBUPROFEN 800 MG PO TABS: 800.0000 mg | ORAL_TABLET | Freq: Four times a day (QID) | ORAL | 0 refills | Status: DC

## 2019-08-24 MED ORDER — TRAMADOL HCL 50 MG PO TABS: 50.0000 mg | ORAL_TABLET | Freq: Four times a day (QID) | ORAL | 0 refills | Status: DC | PRN

## 2019-08-24 MED ORDER — SIMETHICONE 80 MG PO CHEW: 80.0000 mg | CHEWABLE_TABLET | Freq: Three times a day (TID) | ORAL | 0 refills | Status: DC

## 2019-08-24 MED ORDER — FERROUS SULFATE 325 (65 FE) MG PO TABS: 325.0000 mg | ORAL_TABLET | Freq: Two times a day (BID) | ORAL | 3 refills | Status: DC

## 2019-08-24 NOTE — Lactation Note (Signed)
Mom pumping breasts, flange on both breasts in wrong position, mom shown how to adjust flanges to correct position, pumping yellow colostrum, mom for d/c today around 2pm, reviewed getting pump from [facility] and when to return our breast pump, reviewed pumping frequency, at least every 3 hrs, has pumped approx 15 cc from both breasts in approx 9 min.  Mom praised for her efforts at providing breast milk for her baby in SCN., [facility] name and phone no. Written on white board, encouraged to call for questions or concerns

## 2019-08-24 NOTE — Progress Notes (Signed)
Patient discharged home with family. Discharge instructions and prescriptions given and reviewed with patient. Incision cleaning kit provided and educated. Patient verbalized understanding. Infant remains in SCN. Escorted out by auxillary.

## 2019-08-24 NOTE — Discharge Instructions (Signed)

## 2019-08-24 NOTE — Discharge Summary (Signed)
Obstetric Discharge Summary  Patient ID: Abigail Curry MRN: [id number] DOB/AGE: 1995-09-08 24 y.o.   Date of Admission: 08/21/2019  Date of Discharge:  08/24/19  Admitting Diagnosis: Placental abruption in the third trimester at [redacted]w[redacted]d  Secondary Diagnosis: Rh positive, GBS negative, History of marijuana use, Late and limited prenatal care, Rubella and Varicella non-immune  Mode of Delivery: Primary cesarean section- low uterine, transverse     Discharge Diagnosis: No other diagnosis   Intrapartum Procedures: Atificial rupture of membranes and Spinal   Post partum procedures: None  Complications: None   Brief Hospital Course    Abigail Curry is a [id number] who underwent cesarean section on 08/21/2019.  Patient had an uncomplicated surgery; for further details of this surgery, please refer to the operative note.  Patient had an uncomplicated postpartum course.  By time of discharge on POD#3, her pain was controlled on oral pain medications; she had appropriate lochia and was ambulating, voiding without difficulty, tolerating regular diet and passing flatus. She was deemed stable for discharge to home with office follow up visit scheduled for later this week.     Labs: CBC Latest Ref Rng & Units 08/22/2019 08/21/2019 08/12/2019  WBC 4.0 - 10.5 K/uL 25.1(H) 18.4(H) 20.6(HH)  Hemoglobin 12.0 - 15.0 g/dL 10.3(L) 11.9(L) 11.0(L)  Hematocrit 36 - 46 % 31.1(L) 34.9(L) 33.9(L)  Platelets 150 - 400 K/uL 258 322 309   O POS  Physical exam:   Temp:  [98 F (36.7 C)-98.5 F (36.9 C)] 98 F (36.7 C) (07/05 0815) Pulse Rate:  [52-66] 66 (07/05 0815) Resp:  [16-20] 17 (07/05 0815) BP: (110-122)/(61-90) 122/90 (07/05 0815) SpO2:  [99 %-100 %] 100 % (07/05 0815)  General: alert and no distress  Lochia: appropriate  Abdomen: soft, NT  Uterine Fundus: firm  Incision: healing well, no significant drainage, no dehiscence, no significant erythema  Extremities: No evidence of DVT  seen on physical exam. No lower extremity edema.  Edinburgh Postnatal Depression Scale Screening Tool 08/22/2019  I have been able to laugh and see the funny side of things. 0  I have looked forward with enjoyment to things. 0  I have blamed myself unnecessarily when things went wrong. 1  I have been anxious or worried for no good reason. 2  I have felt scared or panicky for no good reason. 0  Things have been getting on top of me. 1  I have been so unhappy that I have had difficulty sleeping. 0  I have felt sad or miserable. 0  I have been so unhappy that I have been crying. 1  The thought of harming myself has occurred to me. 0  Edinburgh Postnatal Depression Scale Total 5     Discharge Instructions: Per After Visit Summary.  Activity: Advance as tolerated. Pelvic rest for 6 weeks.  Also refer to After Visit Summary  Diet: Regular  Medications: Allergies as of 08/24/2019   No Known Allergies     Medication List    TAKE these medications   ferrous sulfate 325 (65 FE) MG tablet Take 1 tablet (325 mg total) by mouth 2 (two) times daily with a meal.   gabapentin 300 MG capsule Commonly known as: NEURONTIN Take 1 capsule (300 mg total) by mouth 2 (two) times daily.   ibuprofen 800 MG tablet Commonly known as: ADVIL Take 1 tablet (800 mg total) by mouth every 6 (six) hours.   prenatal multivitamin Tabs tablet Take 1 tablet by mouth daily at 12 noon.  simethicone 80 MG chewable tablet Commonly known as: MYLICON Chew 1 tablet (80 mg total) by mouth 3 (three) times daily after meals.   traMADol 50 MG tablet Commonly known as: ULTRAM Take 1 tablet (50 mg total) by mouth every 6 (six) hours as needed (mild pain).            Discharge Care Instructions  (From admission, onward)         Start     Ordered   08/24/19 0000  Leave dressing on - Keep it clean, dry, and intact until clinic visit        08/24/19 0957         Outpatient follow up:   Follow-up  Information    Gunnar Bulla, CNM. Go on 08/28/2019.   Specialties: Certified Nurse Midwife, Obstetrics and Gynecology, Radiology Why: Please go to [facility] on Friday at 0900 ofr incision check with [facility] Contact information: [address] [place] [address] [phone]              Postpartum contraception: natural family planning (NFP)  Discharged Condition: stable  Discharged to: home   Newborn Data:  Disposition:Special Care Nursery  Apgars: APGAR (1 MIN): 5   APGAR (5 MINS): 8    Baby Feeding: Breast   Serafina Royals, CNM  [facility], [facility] 08/24/19 9:57 AM

## 2019-08-26 ENCOUNTER — Telehealth: Payer: Self-pay | Admitting: Family Medicine

## 2019-08-27 ENCOUNTER — Encounter: Payer: Medicaid Other | Admitting: Certified Nurse Midwife

## 2019-08-27 ENCOUNTER — Ambulatory Visit (INDEPENDENT_AMBULATORY_CARE_PROVIDER_SITE_OTHER): Payer: Medicaid Other | Admitting: Certified Nurse Midwife

## 2019-08-27 ENCOUNTER — Other Ambulatory Visit: Payer: Self-pay

## 2019-08-27 VITALS — BP 133/81 | HR 60 | Wt 198.2 lb

## 2019-08-27 DIAGNOSIS — Z5189 Encounter for other specified aftercare: Secondary | ICD-10-CM

## 2019-08-27 DIAGNOSIS — G971 Other reaction to spinal and lumbar puncture: Secondary | ICD-10-CM

## 2019-08-27 DIAGNOSIS — Z98891 History of uterine scar from previous surgery: Secondary | ICD-10-CM

## 2019-08-27 DIAGNOSIS — A749 Chlamydial infection, unspecified: Secondary | ICD-10-CM

## 2019-08-27 LAB — SURGICAL PATHOLOGY

## 2019-08-27 MED ORDER — AZITHROMYCIN 500 MG PO TABS: 1000.0000 mg | ORAL_TABLET | Freq: Once | ORAL | 0 refills | Status: AC

## 2019-08-27 MED ORDER — PAROXETINE HCL 10 MG PO TABS
10.0000 mg | ORAL_TABLET | Freq: Every day | ORAL | 1 refills | Status: DC
Start: 2019-08-27 — End: 2020-11-05

## 2019-08-27 NOTE — Telephone Encounter (Signed)
Patient called in stating that her incision has opened. Could you please advise?  Patient requested we give her a call back at [phone].

## 2019-08-27 NOTE — Patient Instructions (Addendum)
Azithromycin tablets What is this medicine? AZITHROMYCIN (az ith roe MYE sin) is a macrolide antibiotic. It is used to treat or prevent certain kinds of bacterial infections. It will not work for colds, flu, or other viral infections. This medicine may be used for other purposes; ask your health care provider or pharmacist if you have questions. COMMON BRAND NAME(S): Zithromax, Zithromax Tri-Pak, Zithromax Z-Pak What should I tell my health care provider before I take this medicine? They need to know if you have any of these conditions:  history of blood diseases, like leukemia  history of irregular heartbeat  kidney disease  liver disease  myasthenia gravis  an unusual or allergic reaction to azithromycin, erythromycin, other macrolide antibiotics, foods, dyes, or preservatives  pregnant or trying to get pregnant  breast-feeding How should I use this medicine? Take this medicine by mouth with a full glass of water. Follow the directions on the prescription label. The tablets can be taken with food or on an empty stomach. If the medicine upsets your stomach, take it with food. Take your medicine at regular intervals. Do not take your medicine more often than directed. Take all of your medicine as directed even if you think your are better. Do not skip doses or stop your medicine early. Talk to your pediatrician regarding the use of this medicine in children. While this drug may be prescribed for children as young as 6 months for selected conditions, precautions do apply. Overdosage: If you think you have taken too much of this medicine contact a poison control center or emergency room at once. NOTE: This medicine is only for you. Do not share this medicine with others. What if I miss a dose? If you miss a dose, take it as soon as you can. If it is almost time for your next dose, take only that dose. Do not take double or extra doses. What may interact with this medicine? Do not take  this medicine with any of the following medications:  cisapride  dronedarone  pimozide  thioridazine This medicine may also interact with the following medications:  antacids that contain aluminum or magnesium  birth control pills  colchicine  cyclosporine  digoxin  ergot alkaloids like dihydroergotamine, ergotamine  nelfinavir  other medicines that prolong the QT interval (an abnormal heart rhythm)  phenytoin  warfarin This list may not describe all possible interactions. Give your health care provider a list of all the medicines, herbs, non-prescription drugs, or dietary supplements you use. Also tell them if you smoke, drink alcohol, or use illegal drugs. Some items may interact with your medicine. What should I watch for while using this medicine? Tell your doctor or healthcare provider if your symptoms do not start to get better or if they get worse. This medicine may cause serious skin reactions. They can happen weeks to months after starting the medicine. Contact your healthcare provider right away if you notice fevers or flu-like symptoms with a rash. The rash may be red or purple and then turn into blisters or peeling of the skin. Or, you might notice a red rash with swelling of the face, lips or lymph nodes in your neck or under your arms. Do not treat diarrhea with over the counter products. Contact your doctor if you have diarrhea that lasts more than 2 days or if it is severe and watery. This medicine can make you more sensitive to the sun. Keep out of the sun. If you cannot avoid being in the   sun, wear protective clothing and use sunscreen. Do not use sun lamps or tanning beds/booths. What side effects may I notice from receiving this medicine? Side effects that you should report to your doctor or health care professional as soon as possible:  allergic reactions like skin rash, itching or hives, swelling of the face, lips, or tongue  bloody or watery  diarrhea  breathing problems  chest pain  fast, irregular heartbeat  muscle weakness  rash, fever, and swollen lymph nodes  redness, blistering, peeling, or loosening of the skin, including inside the mouth  signs and symptoms of liver injury like dark yellow or brown urine; general ill feeling or flu-like symptoms; light-colored stools; loss of appetite; nausea; right upper belly pain; unusually weak or tired; yellowing of the eyes or skin  white patches or sores in the mouth  unusually weak or tired Side effects that usually do not require medical attention (report to your doctor or health care professional if they continue or are bothersome):  diarrhea  nausea  stomach pain  vomiting This list may not describe all possible side effects. Call your doctor for medical advice about side effects. You may report side effects to FDA at 1-800-FDA-1088. Where should I keep my medicine? Keep out of the reach of children. Store at room temperature between 15 and 30 degrees C (59 and 86 degrees F). Throw away any unused medicine after the expiration date. NOTE: This sheet is a summary. It may not cover all possible information. If you have questions about this medicine, talk to your doctor, pharmacist, or health care provider.  2020 Elsevier/Gold Standard (2018-05-15 17:19:20)   Chlamydia, Female  Chlamydia is a STD (sexually transmitted disease). This is an infection that spreads through sexual contact. If it is not treated, it can cause serious problems. It must be treated with antibiotic medicine. If this infection is not treated and you are pregnant or become pregnant, your baby could get it during delivery. This may cause bad health problems for the baby. Sometimes, you may not have symptoms (asymptomatic). When you have symptoms, they can include:  Burning when you pee (urinate).  Peeing often.  Fluid (discharge) coming from the vagina.  Redness, soreness, and swelling  (inflammation) of the butt (rectum).  Bleeding or fluid coming from the butt.  Belly (abdominal) pain.  Pain during sex.  Bleeding between periods.  Itching, burning, or redness in the eyes.  Fluid coming from the eyes. Follow these instructions at home: Medicines  Take over-the-counter and prescription medicines only as told by your doctor.  Take your antibiotic medicine as told by your doctor. Do not stop taking the antibiotic even if you start to feel better. Sexual activity  Tell sex partners about your infection. Sex partners are people you had oral, anal, or vaginal sex with within 60 days of when you started getting sick. They need treatment, too.  Do not have sex until: ? You and your sex partners have been treated. ? Your doctor says it is okay.  If you have a single dose treatment, wait 7 days before having sex. General instructions  It is up to you to get your test results. Ask your doctor when your results will be ready.  Get a lot of rest.  Eat healthy foods.  Drink enough fluid to keep your pee (urine) clear or pale yellow.  Keep all follow-up visits as told by your doctor. You may need tests after 3 months. Preventing chlamydia  The only  way to prevent chlamydia is not to have sex. To lower your risk: ? Use latex condoms correctly. Do this every time you have sex. ? Avoid having many sex partners. ? Ask if your partner has been tested for STDs and if he or she had negative results. Contact a doctor if:  You get new symptoms.  You do not get better with treatment.  You have a fever or chills.  You have pain during sex. Get help right away if:  Your pain gets worse and does not get better with medicine.  You get flu-like symptoms, such as: ? Night sweats. ? Sore throat. ? Muscle aches.  You feel sick to your stomach (nauseous).  You throw up (vomit).  You have trouble swallowing.  You have bleeding: ? Between periods. ? After  sex.  You have irregular periods.  You have belly pain that does not get better with medicine.  You have lower back pain that does not get better with medicine.  You feel weak or dizzy.  You pass out (faint).  You are pregnant and you get symptoms of chlamydia. Summary  Chlamydia is an infection that spreads through sexual contact.  Sometimes, chlamydia can cause no symptoms (asymptomatic).  Do not have sex until your doctor says it is okay.  All sex partners will have to be treated for chlamydia. This information is not intended to replace advice given to you by your health care provider. Make sure you discuss any questions you have with your health care provider. Document Revised: 07/30/2017 Document Reviewed: 01/26/2016 Elsevier Patient Education  2020 Else[employer] Inc.    Postpartum Care After Cesarean Delivery This sheet gives you information about how to care for yourself from the time you deliver your baby to up to 6-12 weeks after delivery (postpartum period). Your health care provider may also give you more specific instructions. If you have problems or questions, contact your health care provider. Follow these instructions at home: Medicines  Take over-the-counter and prescription medicines only as told by your health care provider.  If you were prescribed an antibiotic medicine, take it as told by your health care provider. Do not stop taking the antibiotic even if you start to feel better.  Ask your health care provider if the medicine prescribed to you: ? Requires you to avoid driving or using heavy machinery. ? Can cause constipation. You may need to take actions to prevent or treat constipation, such as:  Drink enough fluid to keep your urine pale yellow.  Take over-the-counter or prescription medicines.  Eat foods that are high in fiber, such as beans, whole grains, and fresh fruits and vegetables.  Limit foods that are high in fat and processed sugars,  such as fried or sweet foods. Activity  Gradually return to your normal activities as told by your health care provider.  Avoid activities that take a lot of effort and energy (are strenuous) until approved by your health care provider. Walking at a slow to moderate pace is usually safe. Ask your health care provider what activities are safe for you. ? Do not lift anything that is heavier than your baby or 10 lb (4.5 kg) as told by your health care provider. ? Do not vacuum, climb stairs, or drive a car for as long as told by your health care provider.  If possible, have someone help you at home until you are able to do your usual activities yourself.  Rest as much as possible. Try to  rest or take naps while your baby is sleeping. Vaginal bleeding  It is normal to have vaginal bleeding (lochia) after delivery. Wear a sanitary pad to absorb vaginal bleeding and discharge. ? During the first week after delivery, the amount and appearance of lochia is often similar to a menstrual period. ? Over the next few weeks, it will gradually decrease to a dry, yellow-brown discharge. ? For most women, lochia stops completely by 4-6 weeks after delivery. Vaginal bleeding can vary from woman to woman.  Change your sanitary pads frequently. Watch for any changes in your flow, such as: ? A sudden increase in volume. ? A change in color. ? Large blood clots.  If you pass a blood clot, save it and call your health care provider to discuss. Do not flush blood clots down the toilet before you get instructions from your health care provider.  Do not use tampons or douches until your health care provider says this is safe.  If you are not breastfeeding, your period should return 6-8 weeks after delivery. If you are breastfeeding, your period may return anytime between 8 weeks after delivery and the time that you stop breastfeeding. Perineal care   If your C-section (Cesarean section) was unplanned, and you  were allowed to labor and push before delivery, you may have pain, swelling, and discomfort of the tissue between your vaginal opening and your anus (perineum). You may also have an incision in the tissue (episiotomy) or the tissue may have torn during delivery. Follow these instructions as told by your health care provider: ? Keep your perineum clean and dry as told by your health care provider. Use medicated pads and pain-relieving sprays and creams as directed. ? If you have an episiotomy or vaginal tear, check the area every day for signs of infection. Check for:  Redness, swelling, or pain.  Fluid or blood.  Warmth.  Pus or a bad smell. ? You may be given a squirt bottle to use instead of wiping to clean the perineum area after you go to the bathroom. As you start healing, you may use the squirt bottle before wiping yourself. Make sure to wipe gently. ? To relieve pain caused by an episiotomy, vaginal tear, or hemorrhoids, try taking a warm sitz bath 2-3 times a day. A sitz bath is a warm water bath that is taken while you are sitting down. The water should only come up to your hips and should cover your buttocks. Breast care  Within the first few days after delivery, your breasts may feel heavy, full, and uncomfortable (breast engorgement). You may also have milk leaking from your breasts. Your health care provider can suggest ways to help relieve breast discomfort. Breast engorgement should go away within a few days.  If you are breastfeeding: ? Wear a bra that supports your breasts and fits you well. ? Keep your nipples clean and dry. Apply creams and ointments as told by your health care provider. ? You may need to use breast pads to absorb milk leakage. ? You may have uterine contractions every time you breastfeed for several weeks after delivery. Uterine contractions help your uterus return to its normal size. ? If you have any problems with breastfeeding, work with your health care  provider or a Advertising copywriter.  If you are not breastfeeding: ? Avoid touching your breasts as this can make your breasts produce more milk. ? Wear a well-fitting bra and use cold packs to help with swelling. ?  Do not squeeze out (express) milk. This causes you to make more milk. Intimacy and sexuality  Ask your health care provider when you can engage in sexual activity. This may depend on your: ? Risk of infection. ? Healing rate. ? Comfort and desire to engage in sexual activity.  You are able to get pregnant after delivery, even if you have not had your period. If desired, talk with your health care provider about methods of family planning or birth control (contraception). Lifestyle  Do not use any products that contain nicotine or tobacco, such as cigarettes, e-cigarettes, and chewing tobacco. If you need help quitting, ask your health care provider.  Do not drink alcohol, especially if you are breastfeeding. Eating and drinking   Drink enough fluid to keep your urine pale yellow.  Eat high-fiber foods every day. These may help prevent or relieve constipation. High-fiber foods include: ? Whole grain cereals and breads. ? Brown rice. ? Beans. ? Fresh fruits and vegetables.  Take your prenatal vitamins until your postpartum checkup or until your health care provider tells you it is okay to stop. General instructions  Keep all follow-up visits for you and your baby as told by your health care provider. Most women visit their health care provider for a postpartum checkup within the first 3-6 weeks after delivery. Contact a health care provider if you:  Feel unable to cope with the changes that a new baby brings to your life, and these feelings do not go away.  Feel unusually sad or worried.  Have breasts that are painful, hard, or turn red.  Have a fever.  Have trouble holding urine or keeping urine from leaking.  Have little or no interest in activities you used  to enjoy.  Have not breastfed at all and you have not had a menstrual period for 12 weeks after delivery.  Have stopped breastfeeding and you have not had a menstrual period for 12 weeks after you stopped breastfeeding.  Have questions about caring for yourself or your baby.  Pass a blood clot from your vagina. Get help right away if you:  Have chest pain.  Have difficulty breathing.  Have sudden, severe leg pain.  Have severe pain or cramping in your abdomen.  Bleed from your vagina so much that you fill more than one sanitary pad in one hour. Bleeding should not be heavier than your heaviest period.  Develop a severe headache.  Faint.  Have blurred vision or spots in your vision.  Have a bad-smelling vaginal discharge.  Have thoughts about hurting yourself or your baby. If you ever feel like you may hurt yourself or others, or have thoughts about taking your own life, get help right away. You can go to your nearest emergency department or call:  Your local emergency services (911 in the [place]  A suicide crisis helpline, such as the National Suicide Prevention Lifeline at 401-802-4480. This is open 24 hours a day. Summary  The period of time from when you deliver your baby to up to 6-12 weeks after delivery is called the postpartum period.  Gradually return to your normal activities as told by your health care provider.  Keep all follow-up visits for you and your baby as told by your health care provider. This information is not intended to replace advice given to you by your health care provider. Make sure you discuss any questions you have with your health care provider. Document Revised: 09/25/2017 Document Reviewed: 09/25/2017 Elsevier Patient Education  2020 Elsevier Inc.  Spinal Headache  A spinal headache is a severe headache that can happen after a person has had a lumbar puncture or epidural anesthesia. During these procedures, a needle is passed between  the bones of the spine. The headache usually starts from a few hours to 1-2 days after that. The headache can last for a few days. In rare cases, it can last for more than a week. What are the causes? This condition is caused by a leak of spinal fluid from the spine through the hole that is left by the needle. What are the signs or symptoms? Symptoms of this condition include:  A severe headache.  A headache that is worse when you sit or stand and better when you lie down.  Neck pain and stiffness, especially when tilting your chin toward your chest.  Nausea and vomiting. How is this diagnosed? This condition is usually diagnosed based on:  Your medical history.  Your symptoms.  A CT scan or MRI of the brain to help rule out other conditions. How is this treated? Treatment for this condition may include:  Replacing fluids that leaked out through the needle hole. Fluids may be replaced by: ? Drinking more fluids. ? Getting fluids through an IV line that is inserted into one of your veins.  Caffeine to help reduce your headache. Your health care provider may recommend drinking caffeinated beverages such as soda, coffee, or tea.  Having an epidural blood patch procedure. In this procedure, a small amount of your blood is injected into the area of the leak in order to seal it.  Medicines for pain.  Resting and lying flat for a few days. Follow these instructions at home:      Take over-the-counter and prescription medicines only as told by your health care provider.  Drink enough fluids to keep your urine pale yellow.  Drink caffeinated beverages as told by your health care provider.  Lie down to relieve pain if your pain gets worse when you sit or stand.  Return to your normal activities as told by your health care provider. Ask your health care provider what activities are safe for you.  Keep all follow-up visits as told by your health care provider. This is  important. Contact a health care provider if:  You develop nausea and vomiting. Get help right away if:  Your pain becomes very severe.  Your pain cannot be controlled.  You develop a fever.  You have a stiff neck.  You develop problems with your vision.  You lose control of your bowel or bladder (have incontinence).  You have trouble walking, you feel weak, or you lose feeling in part of your body. Summary  A spinal headache is a severe headache that can happen after a person has had a lumbar puncture or epidural anesthesia.  This condition is caused by a leak of spinal fluid from the spine through the hole that is left by the needle. The headache can last for a few days. In rare cases, it lasts for more than a week.  Supportive measures, such as drinking more fluid and taking pain medicines, are usually recommended. In some cases, it may be necessary to inject a small amount of your blood to seal the leak. This information is not intended to replace advice given to you by your health care provider. Make sure you discuss any questions you have with your health care provider. Document Revised: 03/21/2017 Document Reviewed: 03/20/2017 Elsevier Patient  Education  [employer].   Paroxetine tablets What is this medicine? PAROXETINE (pa ROX e teen) is used to treat depression. It may also be used to treat anxiety disorders, obsessive compulsive disorder, panic attacks, post traumatic stress, and premenstrual dysphoric disorder (PMDD). This medicine may be used for other purposes; ask your health care provider or pharmacist if you have questions. COMMON BRAND NAME(S): Paxil, Pexeva What should I tell my health care provider before I take this medicine? They need to know if you have any of these conditions:  bipolar disorder or a family history of bipolar disorder  bleeding disorders  glaucoma  heart disease  kidney disease  liver disease  low levels of sodium in  the blood  seizures  suicidal thoughts, plans, or attempt; a previous suicide attempt by you or a family member  take MAOIs like Carbex, Eldepryl, Marplan, Nardil, and Parnate  take medicines that treat or prevent blood clots  thyroid disease  an unusual or allergic reaction to paroxetine, other medicines, foods, dyes, or preservatives  pregnant or trying to get pregnant  breast-feeding How should I use this medicine? Take this medicine by mouth with a glass of water. Follow the directions on the prescription label. You can take it with or without food. Take your medicine at regular intervals. Do not take your medicine more often than directed. Do not stop taking this medicine suddenly except upon the advice of your doctor. Stopping this medicine too quickly may cause serious side effects or your condition may worsen. A special MedGuide will be given to you by the pharmacist with each prescription and refill. Be sure to read this information carefully each time. Talk to your pediatrician regarding the use of this medicine in children. Special care may be needed. Overdosage: If you think you have taken too much of this medicine contact a poison control center or emergency room at once. NOTE: This medicine is only for you. Do not share this medicine with others. What if I miss a dose? If you miss a dose, take it as soon as you can. If it is almost time for your next dose, take only that dose. Do not take double or extra doses. What may interact with this medicine? Do not take this medicine with any of the following medications:  linezolid  MAOIs like Carbex, Eldepryl, Marplan, Nardil, and Parnate  methylene blue (injected into a vein)  pimozide  thioridazine This medicine may also interact with the following medications:  alcohol  amphetamines  aspirin and aspirin-like medicines  atomoxetine  certain medicines for depression, anxiety, or psychotic disturbances  certain  medicines for irregular heart beat like propafenone, flecainide, encainide, and quinidine  certain medicines for migraine headache like almotriptan, eletriptan, frovatriptan, naratriptan, rizatriptan, sumatriptan, zolmitriptan  cimetidine  digoxin  diuretics  fentanyl  fosamprenavir  furazolidone  isoniazid  lithium  medicines that treat or prevent blood clots like warfarin, enoxaparin, and dalteparin  medicines for sleep  NSAIDs, medicines for pain and inflammation, like ibuprofen or naproxen  phenobarbital  phenytoin  procarbazine  rasagiline  ritonavir  supplements like St. John's wort, kava kava, valerian  tamoxifen  tramadol  tryptophan This list may not describe all possible interactions. Give your health care provider a list of all the medicines, herbs, non-prescription drugs, or dietary supplements you use. Also tell them if you smoke, drink alcohol, or use illegal drugs. Some items may interact with your medicine. What should I watch for while using this medicine?  Tell your doctor if your symptoms do not get better or if they get worse. Visit your doctor or health care professional for regular checks on your progress. Because it may take several weeks to see the full effects of this medicine, it is important to continue your treatment as prescribed by your doctor. Patients and their families should watch out for new or worsening thoughts of suicide or depression. Also watch out for sudden changes in feelings such as feeling anxious, agitated, panicky, irritable, hostile, aggressive, impulsive, severely restless, overly excited and hyperactive, or not being able to sleep. If this happens, especially at the beginning of treatment or after a change in dose, call your health care professional. Bonita Quin may get drowsy or dizzy. Do not drive, use machinery, or do anything that needs mental alertness until you know how this medicine affects you. Do not stand or sit up  quickly, especially if you are an older patient. This reduces the risk of dizzy or fainting spells. Alcohol may interfere with the effect of this medicine. Avoid alcoholic drinks. Your mouth may get dry. Chewing sugarless gum or sucking hard candy, and drinking plenty of water will help. Contact your doctor if the problem does not go away or is severe. What side effects may I notice from receiving this medicine? Side effects that you should report to your doctor or health care professional as soon as possible:  allergic reactions like skin rash, itching or hives, swelling of the face, lips, or tongue  anxious  black, tarry stools  changes in vision  confusion  elevated mood, decreased need for sleep, racing thoughts, impulsive behavior  eye pain  fast, irregular heartbeat  feeling faint or lightheaded, falls  feeling agitated, angry, or irritable  hallucination, loss of contact with reality  loss of balance or coordination  loss of memory  painful or prolonged erections  restlessness, pacing, inability to keep still  seizures  stiff muscles  suicidal thoughts or other mood changes  trouble sleeping  unusual bleeding or bruising  unusually weak or tired  vomiting Side effects that usually do not require medical attention (report to your doctor or health care professional if they continue or are bothersome):  change in appetite or weight  change in sex drive or performance  diarrhea  dizziness  dry mouth  increased sweating  indigestion, nausea  tired  tremors This list may not describe all possible side effects. Call your doctor for medical advice about side effects. You may report side effects to FDA at 1-800-FDA-1088. Where should I keep my medicine? Keep out of the reach of children. Store at room temperature between 15 and 30 degrees C (59 and 86 degrees F). Keep container tightly closed. Throw away any unused medicine after the expiration  date. NOTE: This sheet is a summary. It may not cover all possible information. If you have questions about this medicine, talk to your doctor, pharmacist, or health care provider.  2020 Elsevier/Gold Standard (2015-07-09 15:50:32)

## 2019-08-27 NOTE — Telephone Encounter (Signed)
Patient is coming in within the next hour for her incision check. Thanks.

## 2019-08-27 NOTE — Progress Notes (Signed)
OBSTETRICS/GYNECOLOGY POST-OPERATIVE CLINIC VISIT  Subjective:     Abigail Curry is a 24 y.o. female who presents to the clinic 1 week status post primary c-section for placenta abruption at 33 weeks.   Eating a regular diet without difficulty. Bowel movements are normal. Pain is controlled with current analgesics. Medications being used: ibuprofen (OTC).   Reports headache since placement of spinal in OR that worsens with movement.   Denies difficulty breathing or respiratory distress, chest pain, excessive vaginal bleeding, dysuria, and leg pain or swelling.   The following portions of the patient's history were reviewed and updated as appropriate: allergies, current medications, past family history, past medical history, past social history, past surgical history and problem list.  Review of Systems  Pertinent items are noted in HPI.   Objective:    BP 133/81    Pulse 60    Wt 198 lb 3 oz (89.9 kg)    BMI 31.04 kg/m    General:  alert and no distress  Abdomen: soft, bowel sounds active, non-tender  Incision:   healing well, no drainage, no erythema, no hernia, no seroma, no swelling, no dehiscence, incision well approximated   Depression screen [facility] 2/9 08/27/2019  Decreased Interest 0  Down, Depressed, Hopeless 0  PHQ - 2 Score 0  Altered sleeping 0  Tired, decreased energy 2  Change in appetite 2  Feeling bad or failure about yourself  0  Trouble concentrating 0  Moving slowly or fidgety/restless 1  Suicidal thoughts 0  PHQ-9 Score 5  Difficult doing work/chores Somewhat difficult   GAD 7 : Generalized Anxiety Score 08/27/2019  Nervous, Anxious, on Edge 3  Control/stop worrying 3  Worry too much - different things 3  Trouble relaxing 2  Restless 0  Easily annoyed or irritable 3  Afraid - awful might happen 1  Total GAD 7 Score 15  Anxiety Difficulty Not difficult at all   Recent Results (from the past 2160 hour(s))  CBC     Status: Abnormal    Collection Time: 08/12/19  9:10 AM  Result Value Ref Range   WBC 20.6 (HH) 3.4 - 10.8 x10E3/uL   RBC 3.96 3.77 - 5.28 x10E6/uL   Hemoglobin 11.0 (L) 11.1 - 15.9 g/dL   Hematocrit 86.5 (L) 78.4 - 46.6 %   MCV 86 79 - 97 fL   MCH 27.8 26.6 - 33.0 pg   MCHC 32.4 31 - 35 g/dL   RDW 69.6 29.5 - 28.4 %   Platelets 309 150 - 450 x10E3/uL  Glucose, 1 hour gestational     Status: None   Collection Time: 08/12/19  9:10 AM  Result Value Ref Range   Gestational Diabetes Screen 126 65 - 139 mg/dL    Comment: According to ADA, a glucose threshold of >139 mg/dL after 13-KGMW load identifies approximately 80% of women with gestational diabetes mellitus, while the sensitivity is further increased to approximately 90% by a threshold of >129 mg/dL.   RPR     Status: None   Collection Time: 08/12/19  9:10 AM  Result Value Ref Range   RPR Ser Ql Non Reactive Non Reactive  POC Urinalysis Dipstick OB     Status: None   Collection Time: 08/12/19  9:34 AM  Result Value Ref Range   Color, UA yellow    Clarity, UA clear    Glucose, UA Negative Negative   Bilirubin, UA neg    Ketones, UA neg    Spec  Grav, UA 1.015 1.010 - 1.025   Blood, UA neg    pH, UA 5.0 5.0 - 8.0   POC,PROTEIN,UA Negative Negative, Trace, Small (1+), Moderate (2+), Large (3+), 4+   Urobilinogen, UA 0.2 0.2 or 1.0 E.U./dL   Nitrite, UA neg    Leukocytes, UA Negative Negative   Appearance     Odor    ROM Plus ([facility] only)     Status: None   Collection Time: 08/21/19 12:54 PM  Result Value Ref Range   Rom Plus POSITIVE     Comment: Performed at [facility], [address] [place], [place] [address]  CBC     Status: Abnormal   Collection Time: 08/21/19 12:54 PM  Result Value Ref Range   WBC 18.4 (H) 4.0 - 10.5 K/uL   RBC 4.21 3.87 - 5.11 MIL/uL   Hemoglobin 11.9 (L) 12.0 - 15.0 g/dL   HCT 60.434.9 (L) 36 - 46 %   MCV 82.9 80.0 - 100.0 fL   MCH 28.3 26.0 - 34.0 pg   MCHC 34.1 30.0 - 36.0 g/dL   RDW 54.016.9 (H) 98.111.5 -  15.5 %   Platelets 322 150 - 400 K/uL   nRBC 0.0 0.0 - 0.2 %    Comment: Performed at [facility], [address] [place], [place] [address]  Type and screen     Status: None   Collection Time: 08/21/19 12:54 PM  Result Value Ref Range   ABO/RH(D) O POS    Antibody Screen NEG    Sample Expiration      08/24/2019,2359 Performed at [facility], [address] [place], [place] [address]   RPR     Status: None   Collection Time: 08/21/19 12:54 PM  Result Value Ref Range   RPR Ser Ql NON REACTIVE NON REACTIVE    Comment: Performed at [facility], [address] [place], [place] [address]  Wet prep, genital     Status: Abnormal   Collection Time: 08/21/19 12:54 PM  Result Value Ref Range   Yeast Wet Prep HPF POC NONE SEEN NONE SEEN   Trich, Wet Prep NONE SEEN NONE SEEN   Clue Cells Wet Prep HPF POC NONE SEEN NONE SEEN   WBC, Wet Prep HPF POC MANY (A) NONE SEEN   Sperm NONE SEEN     Comment: Performed at [facility], [address] [place], [place] [address]  Group B strep by PCR     Status: None   Collection Time: 08/21/19 12:54 PM   Specimen: Genital  Result Value Ref Range   Group B strep by PCR NEGATIVE NEGATIVE    Comment: (NOTE) Intrapartum testing with Xpert GBS assay should be used as an adjunct to other methods available and not used to replace antepartum testing (at 35-[redacted] weeks gestation). Performed at [facility], [address] [place], [place] [address]   Chlamydia/NGC rt PCR [facility] only)     Status: Abnormal   Collection Time: 08/21/19 12:54 PM   Specimen: Urine, Clean Catch; GU  Result Value Ref Range   Specimen source GC/Chlam ENDOCERVICAL    Chlamydia Tr DETECTED (A) NOT DETECTED   N gonorrhoeae NOT DETECTED NOT DETECTED    Comment: (NOTE) This CT/NG assay has not been evaluated in patients with a history of  hysterectomy. Performed at [facility], [address] [place], [place] [address]    Urine Drug Screen, Qualitative [facility] only)  Status: Abnormal   Collection Time: 08/21/19  1:01 PM  Result Value Ref Range   Tricyclic, Ur Screen NONE DETECTED NONE DETECTED   Amphetamines, Ur Screen NONE DETECTED NONE DETECTED   MDMA (Ecstasy)Ur Screen NONE DETECTED NONE DETECTED   Cocaine Metabolite,Ur [place] NONE DETECTED NONE DETECTED   Opiate, Ur Screen NONE DETECTED NONE DETECTED   Phencyclidine (PCP) Ur S NONE DETECTED NONE DETECTED   Cannabinoid 50 Ng, Ur [place] POSITIVE (A) NONE DETECTED   Barbiturates, Ur Screen NONE DETECTED NONE DETECTED   Benzodiazepine, Ur Scrn NONE DETECTED NONE DETECTED   Methadone Scn, Ur NONE DETECTED NONE DETECTED    Comment: (NOTE) Tricyclics + metabolites, urine    Cutoff 1000 ng/mL Amphetamines + metabolites, urine  Cutoff 1000 ng/mL MDMA (Ecstasy), urine              Cutoff 500 ng/mL Cocaine Metabolite, urine          Cutoff 300 ng/mL Opiate + metabolites, urine        Cutoff 300 ng/mL Phencyclidine (PCP), urine         Cutoff 25 ng/mL Cannabinoid, urine                 Cutoff 50 ng/mL Barbiturates + metabolites, urine  Cutoff 200 ng/mL Benzodiazepine, urine              Cutoff 200 ng/mL Methadone, urine                   Cutoff 300 ng/mL  The urine drug screen provides only a preliminary, unconfirmed analytical test result and should not be used for non-medical purposes. Clinical consideration and professional judgment should be applied to any positive drug screen result due to possible interfering substances. A more specific alternate chemical method must be used in order to obtain a confirmed analytical result. Gas chromatography / mass spectrometry (GC/MS) is the preferred confirm atory method. Performed at [facility], [address] [place], [place] [address]   SARS Coronavirus 2 by RT PCR (hospital order, performed in [facility] hospital lab) Nasopharyngeal Nasopharyngeal Swab     Status: None   Collection Time: 08/21/19   1:01 PM   Specimen: Nasopharyngeal Swab  Result Value Ref Range   SARS Coronavirus 2 NEGATIVE NEGATIVE    Comment: (NOTE) SARS-CoV-2 target nucleic acids are NOT DETECTED.  The SARS-CoV-2 RNA is generally detectable in upper and lower respiratory specimens during the acute phase of infection. The lowest concentration of SARS-CoV-2 viral copies this assay can detect is 250 copies / mL. A negative result does not preclude SARS-CoV-2 infection and should not be used as the sole basis for treatment or other patient management decisions.  A negative result may occur with improper specimen collection / handling, submission of specimen other than nasopharyngeal swab, presence of viral mutation(s) within the areas targeted by this assay, and inadequate number of viral copies (<250 copies / mL). A negative result must be combined with clinical observations, patient history, and epidemiological information.  Fact Sheet for Patients:   BoilerBrush.com.cy  Fact Sheet for Healthcare Providers: https://pope.com/  This test is not yet approved or  cleared by the [place] FDA and has been authorized for detection and/or diagnosis of SARS-CoV-2 by FDA under an Emergency Use Authorization (EUA).  This EUA will remain in effect (meaning this test can be used) for the duration of the COVID-19 declaration under Section 564(b)(1) of the Act, 21 U.S.C. section 360bbb-3(b)(1), unless the  authorization is terminated or revoked sooner.  Performed at [facility], [address] [place], [place] [address]   Urinalysis, Complete w Microscopic     Status: Abnormal   Collection Time: 08/21/19  1:04 PM  Result Value Ref Range   Color, Urine STRAW (A) YELLOW   APPearance CLEAR (A) CLEAR   Specific Gravity, Urine 1.004 (L) 1.005 - 1.030   pH 7.0 5.0 - 8.0   Glucose, UA NEGATIVE NEGATIVE mg/dL   Hgb urine dipstick SMALL (A) NEGATIVE    Bilirubin Urine NEGATIVE NEGATIVE   Ketones, ur NEGATIVE NEGATIVE mg/dL   Protein, ur NEGATIVE NEGATIVE mg/dL   Nitrite NEGATIVE NEGATIVE   Leukocytes,Ua NEGATIVE NEGATIVE   RBC / HPF 0-5 0 - 5 RBC/hpf   WBC, UA 0-5 0 - 5 WBC/hpf   Bacteria, UA NONE SEEN NONE SEEN   Squamous Epithelial / LPF 0-5 0 - 5    Comment: Performed at [facility], [address] [place], [place] [address]  Urine Culture     Status: Abnormal   Collection Time: 08/21/19  1:04 PM   Specimen: Urine, Random  Result Value Ref Range   Specimen Description      URINE, RANDOM Performed at [facility], [address] [place], [place] [address]    Special Requests      NONE Performed at [facility], [address], [place], [place] [address]    Culture (A)     <10,000 COLONIES/mL INSIGNIFICANT GROWTH Performed at [facility], [address] [place], [place] [address]    Report Status 08/22/2019 FINAL   Protein / creatinine ratio, urine     Status: None   Collection Time: 08/21/19  5:09 PM  Result Value Ref Range   Creatinine, Urine 28 mg/dL   Total Protein, Urine <6 mg/dL    Comment: NO NORMAL RANGE ESTABLISHED FOR THIS TEST   Protein Creatinine Ratio        0.00 - 0.15 mg/mg[Cre]    Comment: RESULT BELOW REPORTABLE RANGE, UNABLE TO CALCULATE. Performed at [facility], [address] [place], [place] [address]   CBC     Status: Abnormal   Collection Time: 08/22/19  6:45 AM  Result Value Ref Range   WBC 25.1 (H) 4.0 - 10.5 K/uL   RBC 3.64 (L) 3.87 - 5.11 MIL/uL   Hemoglobin 10.3 (L) 12.0 - 15.0 g/dL   HCT 82.4 (L) 36 - 46 %   MCV 85.4 80.0 - 100.0 fL   MCH 28.3 26.0 - 34.0 pg   MCHC 33.1 30.0 - 36.0 g/dL   RDW 23.5 (H) 36.1 - 44.3 %   Platelets 258 150 - 400 K/uL   nRBC 0.0 0.0 - 0.2 %    Comment: Performed at [facility], [address] [place], [place] [address]    Assessment:   S/P cesarean section Visit for wound  check Positive Chlamydia test Spinal headache   Plan:   1. Continue any current medications. Rx Paxil and Azithromycin, see orders.  2. Wound care discussed. 3. Operative findings again reviewed. Pathology report discussed. 4. Activity restrictions: no lifting more than 10 pounds 5. Anticipated return to work: not applicable. 6. Follow up: 4 weeks for PPV or sooner if needed.   Gunnar Bulla, CNM [facility] 08/27/19  11:34 AM

## 2019-08-27 NOTE — Telephone Encounter (Signed)
Patient can come in for incision check today if able. Thanks, JML

## 2019-08-28 ENCOUNTER — Encounter: Payer: Medicaid Other | Admitting: Certified Nurse Midwife

## 2019-08-31 ENCOUNTER — Ambulatory Visit: Payer: Self-pay

## 2019-08-31 NOTE — Lactation Note (Signed)
This note was copied from a baby's chart. Lactation Consultation Note  Patient Name: Abigail Curry [id number] Date: 08/31/2019   Mom has not returned [facility] rental pump through [facility].  Tried to call mom to see if she had gotten WIC Loaner DEBP through [facility] and to let her know our pump was due back today, but did not answer and could not leave a message. LC will need to follow up when mom comes to visit.    Maternal Data    Feeding Feeding Type: Donor Breast Milk  LATCH Score                   Interventions    Lactation Tools Discussed/Used Tools: Pump;25F feeding tube / Syringe   Consult Status      Abigail Curry 08/31/2019, 11:12 PM

## 2019-09-04 ENCOUNTER — Ambulatory Visit: Payer: Self-pay

## 2019-09-04 NOTE — Lactation Note (Signed)
This note was copied from a baby's chart. Lactation Consultation Note  Patient Name: Abigail Curry Date: 09/04/2019 Reason for consult: Follow-up assessment;1st time breastfeeding;NICU baby;Late-preterm 34-36.6wks   Maternal Data  Mom pumped 2 oz from both breasts before attempting feeding, pumped approx 7-10 min, usually gets approx 4 oz with a full pumping session.  Mom to finish pumping breasts after baby gets tube supplement, pump kit to remain at beside, wash pump pieces with palmolive soap and water and then microwave in microwave bag to clean after    Feeding Feeding Type: Breast Fed BAby latched to left breast with little assist, already rooting at breast, just needed support of breast and shaping of breast tissue to get nipple deep in mouth, maintained latch for approx. 15 min, strong sucks with swallows noted in beginning x approx 5 min, light swallows noted, at end of feeding was having fluttery sucks, mom states not as  Strong as breast pump, no stimulation needed to suck during session, suction needed to be broken with gloved finger in corner of mouth, to be removed from breast.    LATCH Score Latch: Grasps breast easily, tongue down, lips flanged, rhythmical sucking.  Audible Swallowing: Spontaneous and intermittent (x5 min, strong, 10 min fluttery)  Type of Nipple: Everted at rest and after stimulation  Comfort (Breast/Nipple): Soft / non-tender  Hold (Positioning): Assistance needed to correctly position infant at breast and maintain latch.  LATCH Score: 9  Interventions Interventions: Assisted with latch;Pre-pump if needed;Breast compression;Support pillows;DEBP  Lactation Tools Discussed/Used Tools: 59F feeding tube / Syringe WIC Program: Yes   Consult Status Consult Status: Follow-up Date: 09/05/19 Follow-up type: In-patient To try at breast again tomorrow if baby interested.   Ferol Luz 09/04/2019, 2:44 PM

## 2019-09-08 ENCOUNTER — Ambulatory Visit: Payer: Self-pay

## 2019-09-08 NOTE — Lactation Note (Signed)
This note was copied from a baby's chart. Lactation Consultation Note  Patient Name: Abigail Curry [id number] Date: 09/08/2019 Reason for consult: Follow-up assessment;Preterm <34wks;NICU baby ([place])  Lactation present for 9:30am feeding at the breast. Mom independently attempting to latch baby, baby initially latched but developed hiccups; once resolved, after several attempts baby latched and began strong rhythmic sucking pattern with identified swallows. Seven minutes in, baby did have a dsat episode, and took a few minutes to recover and begin to feed once more.  Mom felt/appeared comfortable with baby at the breast and working to get baby latched. Continued to support mom with pillows, additional hand support, throughout another additional 5 minutes. LC praised mom, encouraged continued dedication to pumping, and encouraged to ask questions and for support anytime she is visiting.  Maternal Data Formula Feeding for Exclusion: No Has patient been taught Hand Expression?: Yes Does the patient have breastfeeding experience prior to this delivery?: No  Feeding Feeding Type: Breast Milk  LATCH Score Latch: Repeated attempts needed to sustain latch, nipple held in mouth throughout feeding, stimulation needed to elicit sucking reflex.  Audible Swallowing: Spontaneous and intermittent  Type of Nipple: Everted at rest and after stimulation  Comfort (Breast/Nipple): Soft / non-tender  Hold (Positioning): Assistance needed to correctly position infant at breast and maintain latch.  LATCH Score: 8  Interventions Interventions: Breast feeding basics reviewed;Assisted with latch;Adjust position;Support pillows;Position options  Lactation Tools Discussed/Used Tools: 61F feeding tube / Syringe;Pump WIC Program: Yes   Consult Status Consult Status: PRN Date: 09/08/19 Follow-up type: Call as needed    Danford Bad 09/08/2019, 10:55 AM

## 2019-09-13 ENCOUNTER — Ambulatory Visit: Payer: Self-pay

## 2019-09-13 NOTE — Lactation Note (Signed)
This note was copied from a baby's chart. Lactation Consultation Note  Patient Name: Abigail Curry [id number] Date: 09/13/2019   Mom did not bring in any breast milk at this visit. Mom reports milk supply dwindling.  Hand out given and discussed ways to increase milk supply by breast massage, warmth, hand expression, power pumping, super pumping, skin to skin, increasing frequency of pumping, diet, increasing fluid intake, galactogogues, lactation cookies, etc.  Mom will be rooming in with baby over night.  Discussed with mom about going to [facility] again tomorrow or some time this week as soon as she can to get [facility] loaner pump so she can return our pump.  Maternal Data    Feeding    LATCH Score                   Interventions    Lactation Tools Discussed/Used     Consult Status      Abigail Curry 09/13/2019, 9:30 PM

## 2019-09-14 ENCOUNTER — Ambulatory Visit: Payer: Self-pay

## 2019-09-14 NOTE — Lactation Note (Signed)
This note was copied from a baby's chart. Lactation Consultation Note  Patient Name: Abigail Curry [id number]'I Date: 09/14/2019   Mom went to [facility] to get DEBP before taking [name] home today.  The only pump they could give her was a single use DEBP.  Mom tried pumping today and only got around 10 to 15 ml.  Mom had been pumping large amounts before and bringing into [facility] for him.  Neonatologist was concerned about her milk supply so for now wants her to keep pumping but to give bottles of Similac Special Care 24 calorie formula instead of putting him to breast.  Mom had been given a [employer] pump through [facility] for a week, but had extended the rental until August 12.  Told mom to keep our pump until Wednesday to see if she could get her milk supply built back up. Hand outs were given on how to increase her milk supply and reviewed what might work best for her. She has a 25 year old at home and had been trying to work.  Her mom is her support person.  Encouraged mom or GOB to call lactation with any questions, concerns or assistance.     Maternal Data    Feeding    LATCH Score                   Interventions    Lactation Tools Discussed/Used     Consult Status      Abigail Curry 09/14/2019, 10:20 PM

## 2019-09-17 ENCOUNTER — Encounter: Payer: Medicaid Other | Admitting: Certified Nurse Midwife

## 2020-01-22 ENCOUNTER — Encounter: Payer: Medicaid Other | Admitting: Certified Nurse Midwife

## 2020-02-22 IMAGING — US TRANSVAGINAL OB ULTRASOUND
1 series · 13 of 28 positions shown · non-contrast
Comparison: No priors.

CLINICAL DATA: 22-year-old female with history of pelvic pain and
vaginal bleeding. Positive pregnancy test.

EXAM:
OBSTETRIC <14 WK US AND TRANSVAGINAL OB US
TECHNIQUE: Both transabdominal and transvaginal ultrasound examinations were
performed for complete evaluation of the gestation as well as the
maternal uterus, adnexal regions, and pelvic cul-de-sac.
Transvaginal technique was performed to assess early pregnancy.

[Series 1: transvaginal ob ultrasound · 78 acquisitions, 13 frames shown]
[im 3/78]
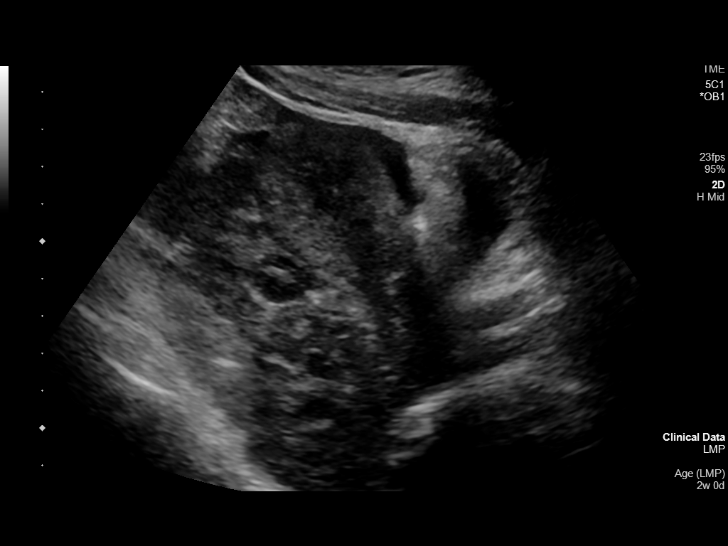
[im 9/78]
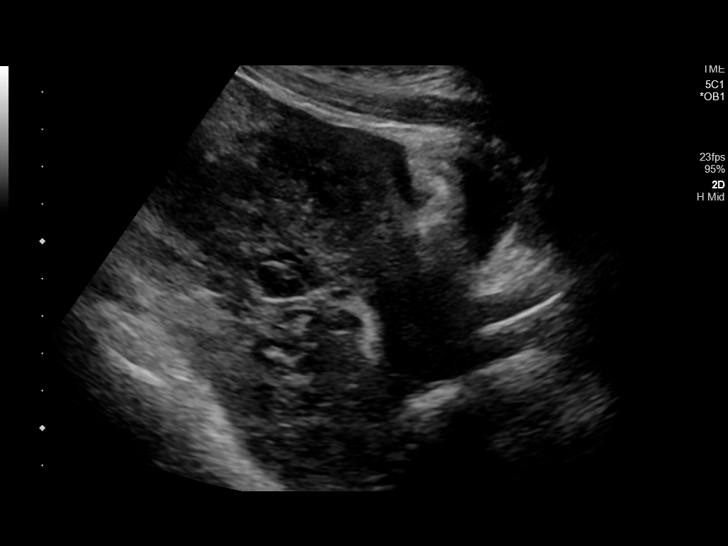
[im 15/78]
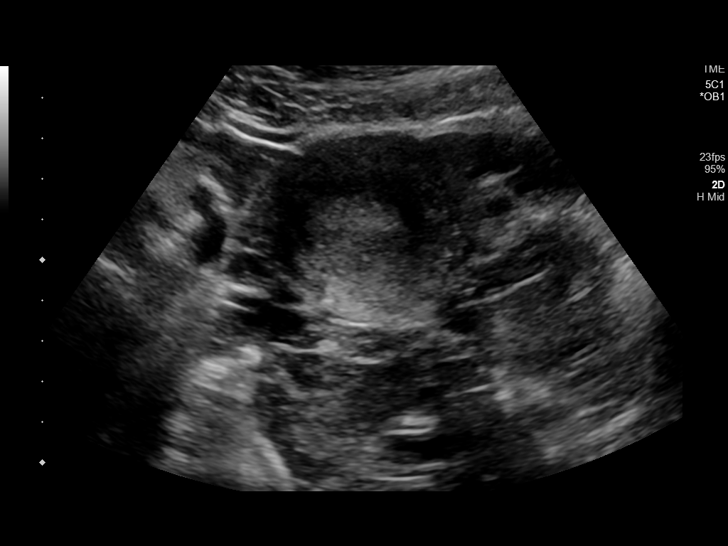
[im 20/78]
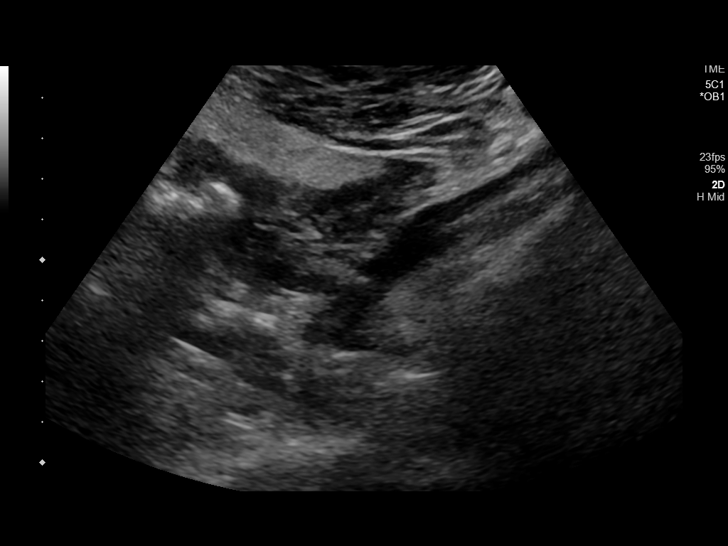
[im 26/78]
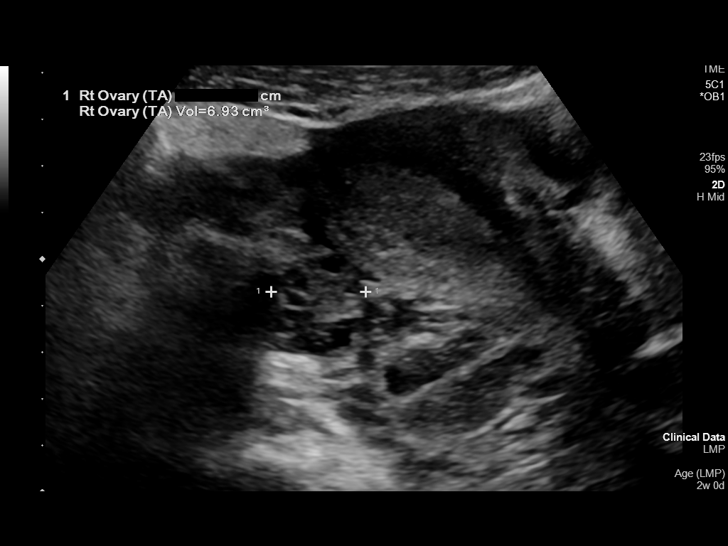
[im 32/78]
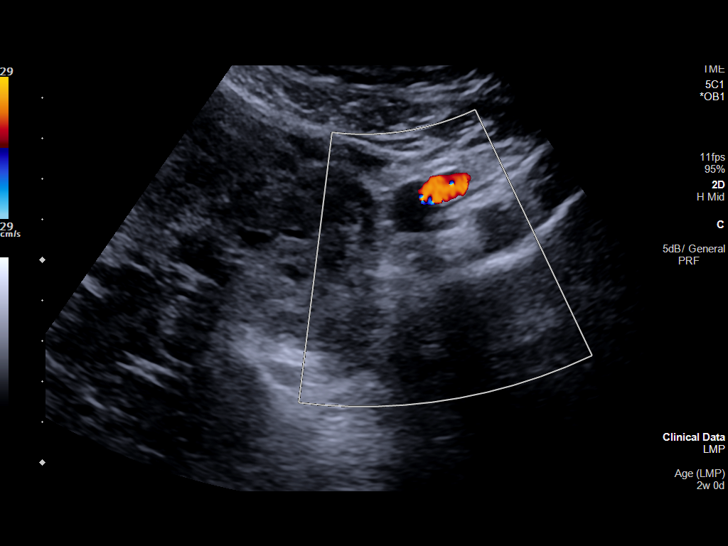
[im 40/78]
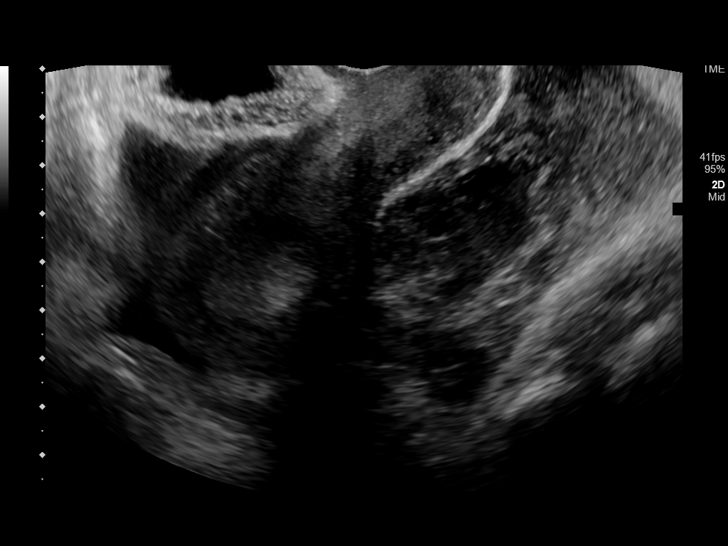
[im 46/78]
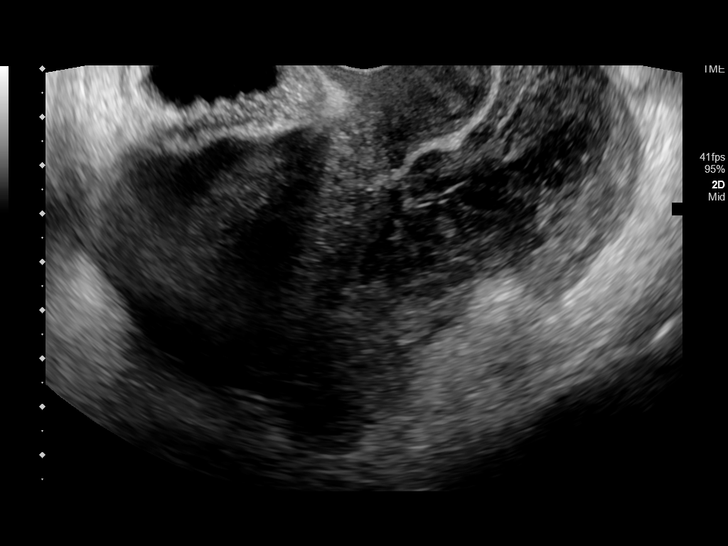
[im 52/78]
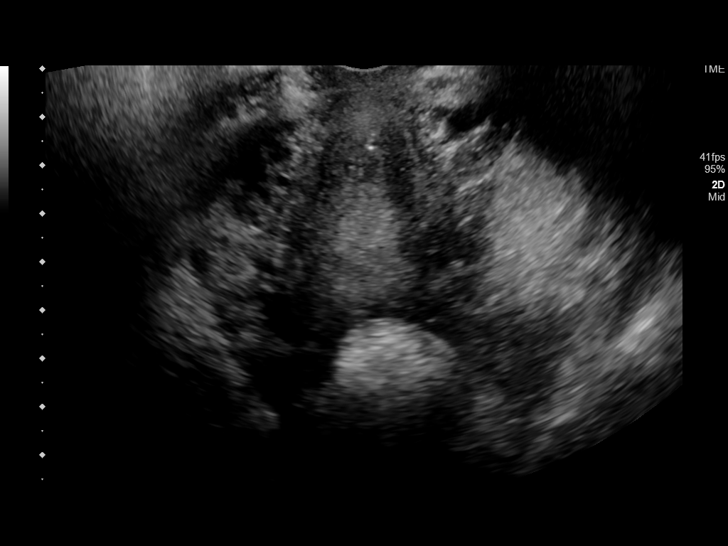
[im 58/78]
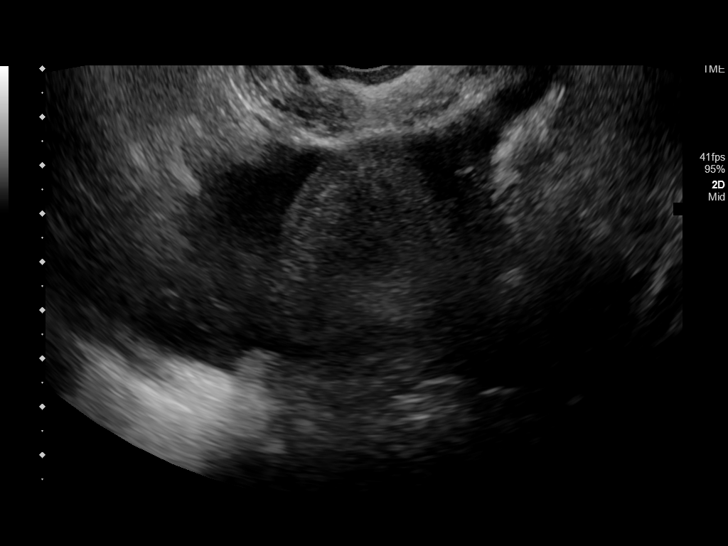
[im 63/78]
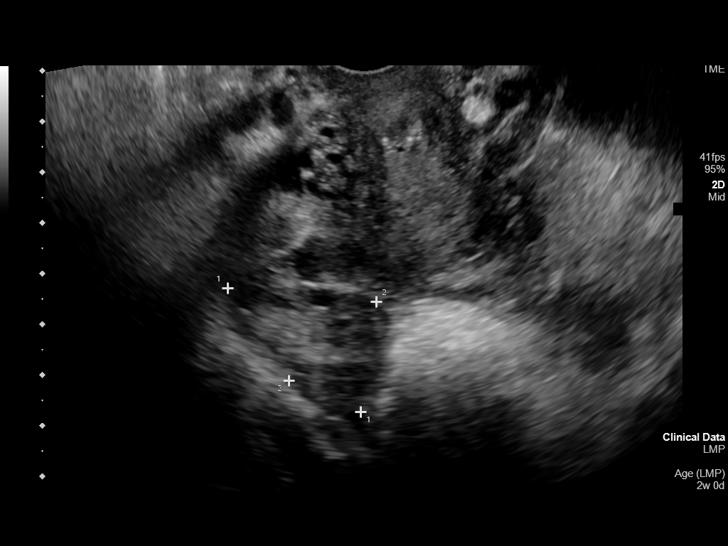
[im 69/78]
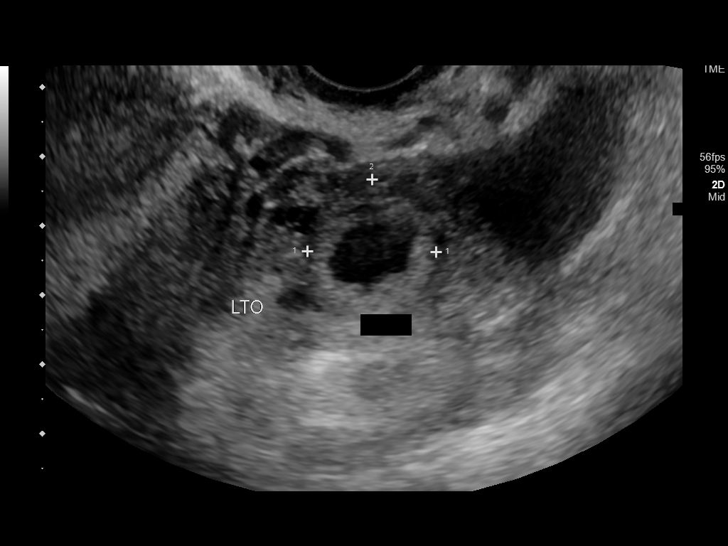
[im 75/78]
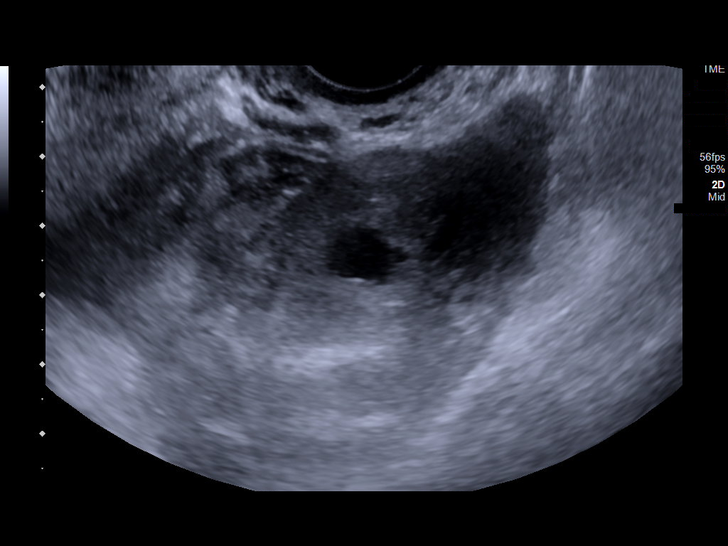

[13 of 28 positions shown; findings below may reference images not displayed]

FINDINGS: Intrauterine gestational sac: None.

Yolk sac:  None.

Embryo:  None.

Cardiac Activity: None.

Heart Rate: N/A

Maternal uterus/adnexae: Uterus and ovaries are normal in
appearance. However, adjacent to the left ovary in the left adnexal
region there is a 1.9 x 2.0 x 2.1 cm lesion that is peripherally of
intermediate to high echogenicity, centrally hypoechoic and
demonstrates some posterior acoustic enhancement, concerning for
ectopic pregnancy. Moderate volume of free fluid in the cul-de-sac.
IMPRESSION: 1. No IUP identified.
2. Findings are highly concerning for potential ectopic pregnancy in
the left adnexal region, as above.

Critical Value/emergent results were called by telephone at the time
of interpretation on 05/10/2018 at [DATE] to Dr. SANJU SCHULTZ, who
verbally acknowledged these results.

## 2020-05-20 ENCOUNTER — Ambulatory Visit: Payer: Medicaid Other

## 2020-05-23 ENCOUNTER — Ambulatory Visit: Payer: Medicaid Other

## 2020-07-19 ENCOUNTER — Other Ambulatory Visit: Payer: Self-pay

## 2020-07-19 ENCOUNTER — Ambulatory Visit (LOCAL_COMMUNITY_HEALTH_CENTER): Payer: Medicare Other | Admitting: Physician Assistant

## 2020-07-19 VITALS — BP 107/70 | Ht 66.0 in | Wt 173.0 lb

## 2020-07-19 DIAGNOSIS — Z01419 Encounter for gynecological examination (general) (routine) without abnormal findings: Secondary | ICD-10-CM | POA: Diagnosis not present

## 2020-07-19 DIAGNOSIS — Z3009 Encounter for other general counseling and advice on contraception: Secondary | ICD-10-CM | POA: Diagnosis not present

## 2020-07-19 DIAGNOSIS — Z30018 Encounter for initial prescription of other contraceptives: Secondary | ICD-10-CM

## 2020-07-19 DIAGNOSIS — Z113 Encounter for screening for infections with a predominantly sexual mode of transmission: Secondary | ICD-10-CM

## 2020-07-19 LAB — WET PREP FOR TRICH, YEAST, CLUE
Trichomonas Exam: NEGATIVE
Yeast Exam: NEGATIVE

## 2020-07-19 NOTE — Progress Notes (Signed)
Wet mount reviewed, no TX per standing order or provider.

## 2020-07-19 NOTE — Progress Notes (Signed)
[facility] DEPARTMENT [facility] [address] Main Number: [phone]    Family Planning Visit- Initial Visit  Subjective:  Abigail Curry is a 25 y.o.  [id number]   being seen today for an initial annual visit and to discuss contraceptive options.  The patient is currently using Condoms for pregnancy prevention. Patient reports she does not want a pregnancy in the next year.  Patient has the following medical conditions has Bilateral sensorineural hearing loss; History of chlamydia; History of trichomoniasis; Rubella non-immune status, antepartum; Maternal varicella, non-immune; History of marijuana use; Indication for care in labor and delivery, antepartum; Indication for care or intervention in labor or delivery; Placental abruption in third trimester; Vaginal bleeding during pregnancy; Cesarean delivery delivered; Placenta abruptio, antepartum, third trimester; Limited prenatal care in third trimester; and Late prenatal care on their problem list.  Chief Complaint  Patient presents with  . Contraception    Annual visit    Patient reports that she is doing well using condoms as her BCM.  States that she is here for a physical/"check up".  Per chart review, CBE is due 2024 and pap is due today.  Reports that she has been having more frequent headaches due to hot weather and that they are relieved with OTC Ibuprofen.  Reports that she has also had some vaginal itching and frequent urination.  Patient denies any other concerns.   Body mass index is 27.92 kg/m. - Patient is eligible for diabetes screening based on BMI and age >49?  not applicable HA1C ordered? not applicable  Patient reports 2  partner/s in last year. Desires STI screening?  Yes  Has patient been screened once for HCV in the past?  No  No results found for: HCVAB  Does the patient have current drug use (including MJ), have a partner with drug use, and/or has been incarcerated  since last result? No  If yes-- Screen for HCV through [facility]   Does the patient meet criteria for HBV testing? No  Criteria:  -Household, sexual or needle sharing contact with HBV -History of drug use -HIV positive -Those with known Hep C   Health Maintenance Due  Topic Date Due  . HPV VACCINES (1 - 2-dose series) Never done  . Hepatitis C Screening  Never done  . PAP-Cervical Cytology Screening  07/04/2019  . PAP SMEAR-Modifier  07/04/2019    Review of Systems  All other systems reviewed and are negative.   The following portions of the patient's history were reviewed and updated as appropriate: allergies, current medications, past family history, past medical history, past social history, past surgical history and problem list. Problem list updated.   See flowsheet for other program required questions.  Objective:   Vitals:   07/19/20 1027 07/19/20 1035  BP: 107/70   Weight: 173 lb (78.5 kg)   Height:  5\' 6"  (1.676 m)    Physical Exam Vitals and nursing note reviewed.  Constitutional:      General: She is not in acute distress.    Appearance: Normal appearance.  HENT:     Head: Normocephalic and atraumatic.     Mouth/Throat:     Mouth: Mucous membranes are moist.     Pharynx: Oropharynx is clear. No oropharyngeal exudate or posterior oropharyngeal erythema.  Eyes:     Conjunctiva/sclera: Conjunctivae normal.  Neck:     Thyroid: No thyroid mass, thyromegaly or thyroid tenderness.  Cardiovascular:     Rate and  Rhythm: Normal rate and regular rhythm.  Pulmonary:     Effort: Pulmonary effort is normal.     Breath sounds: Normal breath sounds.  Chest:  Breasts:     Right: Normal.     Left: Normal.    Abdominal:     Palpations: Abdomen is soft. There is no mass.     Tenderness: There is no abdominal tenderness. There is no guarding or rebound.  Genitourinary:    General: Normal vulva.     Rectum: Normal.     Comments: External genitalia/pubic  area without nits, lice, edema, erythema, lesions and inguinal adenopathy. Vagina with normal mucosa and discharge. Cervix without visible lesions. Uterus firm, mobile, nt, no masses, no CMT, no adnexal tenderness or fullness. Musculoskeletal:     Cervical back: Neck supple. No tenderness.  Lymphadenopathy:     Cervical: No cervical adenopathy.  Skin:    General: Skin is warm and dry.     Findings: No bruising, erythema, lesion or rash.  Neurological:     Mental Status: She is alert and oriented to person, place, and time.  Psychiatric:        Mood and Affect: Mood normal.        Behavior: Behavior normal.        Thought Content: Thought content normal.        Judgment: Judgment normal.       Assessment and Plan:  Abigail Curry is a 25 y.o. female presenting to the [facility] Department for an initial annual wellness/contraceptive visit  Contraception counseling: Reviewed all forms of birth control options in the tiered based approach. available including abstinence; over the counter/barrier methods; hormonal contraceptive medication including pill, patch, ring, injection,contraceptive implant, ECP; hormonal and nonhormonal IUDs; permanent sterilization options including vasectomy and the various tubal sterilization modalities. Risks, benefits, and typical effectiveness rates were reviewed.  Questions were answered.  Written information was also given to the patient to review.  Patient desires to continue with condoms, this was prescribed for patient. She will follow up in  1 year and prn for surveillance.  She was told to call with any further questions, or with any concerns about this method of contraception.  Emphasized use of condoms 100% of the time for STI prevention.  Patient was not a candidate for ECP today.   1. Encounter for counseling regarding contraception Reviewed with patient as above re:  Hormonal BCM. Reviewed with patient that she can add OTC  spermicides to the condoms for better effectiveness. Enc condoms with all sex for STD and pregnancy prevention.  2. Screening for STD (sexually transmitted disease) Await test results.  Counseled that RN will call if needs to RTC for treatment once results are back.  - WET PREP FOR TRICH, YEAST, CLUE - Chlamydia/Gonorrhea [facility] - HIV/HCV [facility] - Syphilis Serology, [facility]  3. Well woman exam with routine gynecological exam Reviewed with patient healthy habits to maintain general health. Enc MVI 1 po daily. Enc to establish with/ follow up with PCP for primary care concerns, age appropriate screenings and illness. Await pap results.  Counseled that RN will call or send a letter once results are back.  - IGP, rfx Aptima HPV ASCU  4. Evaluation for contraception barrier or spermicide Continue with condoms always for STD and pregnancy prevention. Enc to RTC if changes her mind and would like to try a hormonal method.     Return in about 1 year (  around 07/19/2021) for RP and prn.  No future appointments.  Matt Holmes, PA

## 2020-07-22 LAB — IGP, RFX APTIMA HPV ASCU: PAP Smear Comment: 0

## 2020-07-22 LAB — HM HEPATITIS C SCREENING LAB: HM Hepatitis Screen: NEGATIVE

## 2020-07-22 LAB — HM HIV SCREENING LAB: HM HIV Screening: NEGATIVE

## 2020-07-27 ENCOUNTER — Other Ambulatory Visit: Payer: Self-pay

## 2020-07-27 ENCOUNTER — Ambulatory Visit: Payer: Medicare Other

## 2020-07-27 DIAGNOSIS — A549 Gonococcal infection, unspecified: Secondary | ICD-10-CM

## 2020-07-27 MED ORDER — CEFTRIAXONE SODIUM 500 MG IJ SOLR
500.0000 mg | Freq: Once | INTRAMUSCULAR | Status: AC
  Administered 2020-07-27: 500 mg via INTRAMUSCULAR

## 2020-07-27 NOTE — Progress Notes (Signed)
In nurse clinic for gonorrhea treatment. Pt is hard of hearing, but is able to communicate by reading lips and written word. Treated today with Ceftriaxone per SO Dr. Ralene Bathe. Tolerated well and stayed for 20 min observation without problem. Questions answered and reports understanding. Jerel Shepherd, RN

## 2020-11-02 ENCOUNTER — Telehealth: Payer: Self-pay | Admitting: Family Medicine

## 2020-11-02 NOTE — Telephone Encounter (Signed)
I am having lower stomach pain and bloody stools.

## 2020-11-04 ENCOUNTER — Encounter: Payer: Self-pay | Admitting: Emergency Medicine

## 2020-11-04 ENCOUNTER — Emergency Department
Admission: EM | Admit: 2020-11-04 | Discharge: 2020-11-04 | Disposition: A | Discharge status: Home / Self Care | Payer: Medicare Other | Attending: Emergency Medicine | Admitting: Emergency Medicine

## 2020-11-04 ENCOUNTER — Other Ambulatory Visit: Payer: Self-pay

## 2020-11-04 DIAGNOSIS — N3000 Acute cystitis without hematuria: Secondary | ICD-10-CM | POA: Diagnosis not present

## 2020-11-04 DIAGNOSIS — A609 Anogenital herpesviral infection, unspecified: Secondary | ICD-10-CM

## 2020-11-04 DIAGNOSIS — N898 Other specified noninflammatory disorders of vagina: Secondary | ICD-10-CM | POA: Diagnosis present

## 2020-11-04 DIAGNOSIS — A64 Unspecified sexually transmitted disease: Secondary | ICD-10-CM | POA: Diagnosis not present

## 2020-11-04 LAB — WET PREP, GENITAL
Clue Cells Wet Prep HPF POC: NONE SEEN
Sperm: NONE SEEN
Trich, Wet Prep: NONE SEEN
Yeast Wet Prep HPF POC: NONE SEEN

## 2020-11-04 LAB — URINALYSIS, COMPLETE (UACMP) WITH MICROSCOPIC
Bacteria, UA: NONE SEEN
Glucose, UA: NEGATIVE mg/dL
Hgb urine dipstick: NEGATIVE
Ketones, ur: 20 mg/dL — AB
Nitrite: NEGATIVE
Protein, ur: 100 mg/dL — AB
Specific Gravity, Urine: 1.027 (ref 1.005–1.030)
WBC, UA: >50 WBC/hpf — ABNORMAL HIGH (ref 0–5)
pH: 5 (ref 5.0–8.0)

## 2020-11-04 LAB — CHLAMYDIA/NGC RT PCR (ARMC ONLY)
Chlamydia Tr: NOT DETECTED
N gonorrhoeae: DETECTED — AB

## 2020-11-04 LAB — POC URINE PREG, ED: Preg Test, Ur: NEGATIVE

## 2020-11-04 MED ORDER — CEFTRIAXONE SODIUM 1 G IJ SOLR
500.0000 mg | Freq: Once | INTRAMUSCULAR | Status: DC
  Filled 2020-11-04: qty 10

## 2020-11-04 MED ORDER — VALACYCLOVIR HCL 1 G PO TABS: 2000.0000 mg | ORAL_TABLET | Freq: Two times a day (BID) | ORAL | 0 refills | Status: AC

## 2020-11-04 MED ORDER — DOXYCYCLINE HYCLATE 100 MG PO CAPS: 100.0000 mg | ORAL_CAPSULE | Freq: Two times a day (BID) | ORAL | 0 refills | Status: AC

## 2020-11-04 NOTE — ED Triage Notes (Signed)
C/O bumps to perineum x 3 days.  Also c/o burning with urination.

## 2020-11-04 NOTE — ED Provider Notes (Addendum)
[facility] Emergency Department Provider Note  ____________________________________________   Event Date/Time   First MD Initiated Contact with Patient 11/04/20 1441     (approximate)  I have reviewed the triage vital signs and the nursing notes.   HISTORY  Chief Complaint Groin Pain   HPI Abigail Curry is a 25 y.o. 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  She states she is also noted some abnormal discolored discharge from her vagina for last couple days.  She has no fevers, headache, earache, sore throat, nausea, vomiting, diarrhea states she is not sure if there was a little blood from her vagina or her mixed with her stool today.  She has not been constipated.  She denies any rectal pain.  Denies any abdominal pain, back pain, other areas of bump or rash or any other acute sick symptoms.  She notes she is currently sexually active.  No other acute concerns at this time.         Past Medical History:  Diagnosis Date   Deaf, right birth   Hard of hearing    LEFT EAR    Patient Active Problem List   Diagnosis Date Noted   Limited prenatal care in third trimester    Late prenatal care    Indication for care in labor and delivery, antepartum 08/21/2019   Indication for care or intervention in labor or delivery 08/21/2019   Placenta abruptio, antepartum, third trimester 08/21/2019   Placental abruption in third trimester    Vaginal bleeding during pregnancy    Cesarean delivery delivered    History of marijuana use 05/14/2019   Rubella non-immune status, antepartum 05/08/2019   Maternal varicella, non-immune 05/08/2019   History of chlamydia 04/18/2019   History of trichomoniasis 04/18/2019   Bilateral sensorineural hearing loss 08/11/2012    Past Surgical  History:  Procedure Laterality Date   CESAREAN SECTION  08/21/2019   Procedure: CESAREAN SECTION;  Surgeon: Hildred Laser, MD;  Location: [facility] ORS;  Service: Obstetrics;;   COCHLEAR IMPLANT     DIAGNOSTIC LAPAROSCOPY WITH REMOVAL OF ECTOPIC PREGNANCY Left 05/10/2018   Procedure: DIAGNOSTIC LAPAROSCOPY WITH REMOVAL OF ECTOPIC PREGNANCY;  Surgeon: Christeen Douglas, MD;  Location: [facility] ORS;  Service: Gynecology;  Laterality: Left;    Prior to Admission medications   Medication Sig Start Date End Date Taking? Authorizing Provider  doxycycline (VIBRAMYCIN) 100 MG capsule Take 1 capsule (100 mg total) by mouth 2 (two) times daily for 14 days. 11/04/20 11/18/20 Yes Gilles Chiquito, MD  valACYclovir (VALTREX) 1000 MG tablet Take 2 tablets (2,000 mg total) by mouth 2 (two) times daily for 7 days. 11/04/20 11/11/20 Yes Gilles Chiquito, MD  ferrous sulfate 325 (65 FE) MG tablet Take 1 tablet (325 mg total) by mouth 2 (two) times daily with a meal. Patient not taking: Reported on 07/19/2020 08/24/19   Gunnar Bulla, CNM  ibuprofen (ADVIL) 800 MG tablet Take 1 tablet (800 mg total) by mouth every 6 (six) hours. Patient not taking: Reported on 07/19/2020 08/24/19   Gunnar Bulla, CNM  PARoxetine (PAXIL) 10 MG tablet Take 1 tablet (10 mg total) by mouth daily. Patient not taking: Reported on 07/19/2020 08/27/19   Gunnar Bulla, CNM  simethicone (MYLICON) 80 MG chewable tablet Chew 1 tablet (  80 mg total) by mouth 3 (three) times daily after meals. Patient not taking: Reported on 07/19/2020 08/24/19   Gunnar Bulla, CNM    Allergies Patient has no known allergies.  Family History  Problem Relation Age of Onset   Diabetes Mother    Sickle cell anemia Brother    Breast cancer Neg Hx    Ovarian cancer Neg Hx    Colon cancer Neg Hx     Social History Social History   Tobacco Use   Smoking status: Never   Smokeless tobacco: Never  Vaping Use   Vaping Use: Every day   Substance Use Topics   Alcohol use: Not Currently   Drug use: Not Currently    Review of Systems  Review of Systems  Constitutional:  Negative for chills and fever.  HENT:  Negative for sore throat.   Eyes:  Negative for pain.  Respiratory:  Negative for cough and stridor.   Cardiovascular:  Negative for chest pain.  Gastrointestinal:  Negative for vomiting.  Genitourinary:  Positive for dysuria.  Musculoskeletal:  Negative for myalgias.  Skin:  Negative for rash.  Neurological:  Negative for seizures, loss of consciousness and headaches.  Psychiatric/Behavioral:  Negative for suicidal ideas.   All other systems reviewed and are negative.    ____________________________________________   PHYSICAL EXAM:  VITAL SIGNS: ED Triage Vitals  Enc Vitals Group     BP 11/04/20 1325 114/79     Pulse Rate 11/04/20 1325 96     Resp 11/04/20 1325 16     Temp 11/04/20 1325 98.5 F (36.9 C)     Temp Source 11/04/20 1325 Oral     SpO2 11/04/20 1325 97 %     Weight 11/04/20 1323 173 lb 1 oz (78.5 kg)     Height 11/04/20 1323 5\' 3"  (1.6 m)     Head Circumference --      Peak Flow --      Pain Score 11/04/20 1323 10     Pain Loc --      Pain Edu? --      Excl. in GC? --    Vitals:   11/04/20 1325  BP: 114/79  Pulse: 96  Resp: 16  Temp: 98.5 F (36.9 C)  SpO2: 97%   Physical Exam Vitals and nursing note reviewed. Exam conducted with a chaperone present.  Constitutional:      General: She is not in acute distress.    Appearance: She is well-developed.  HENT:     Head: Normocephalic and atraumatic.     Right Ear: External ear normal.     Left Ear: External ear normal.     Nose: Nose normal.  Eyes:     Conjunctiva/sclera: Conjunctivae normal.  Cardiovascular:     Rate and Rhythm: Normal rate and regular rhythm.     Heart sounds: No murmur heard. Pulmonary:     Effort: Pulmonary effort is normal. No respiratory distress.     Breath sounds: Normal breath sounds.   Abdominal:     Palpations: Abdomen is soft.     Tenderness: There is no abdominal tenderness.  Musculoskeletal:     Cervical back: Neck supple.  Skin:    General: Skin is warm and dry.     Capillary Refill: Capillary refill takes less than 2 seconds.  Neurological:     Mental Status: She is alert and oriented to person, place, and time.  Psychiatric:        Mood  and Affect: Mood normal.    GU exam patient does have some erythema and swelling of both labia majora with several umbilicated ulcerated lesions and vesicular lesions.  No purulent drainage.  Attempted pelvic exam although patient is unable to tolerate this at this time secondary to pain from these lesions. ____________________________________________   LABS (all labs ordered are listed, but only abnormal results are displayed)  Labs Reviewed  WET PREP, GENITAL - Abnormal; Notable for the following components:      Result Value   WBC, Wet Prep HPF POC PRESENT (*)    All other components within normal limits  URINALYSIS, COMPLETE (UACMP) WITH MICROSCOPIC - Abnormal; Notable for the following components:   Color, Urine AMBER (*)    APPearance CLOUDY (*)    Bilirubin Urine SMALL (*)    Ketones, ur 20 (*)    Protein, ur 100 (*)    Leukocytes,Ua MODERATE (*)    WBC, UA >50 (*)    All other components within normal limits  CHLAMYDIA/NGC RT PCR ([facility] ONLY)            URINE CULTURE  HIV ANTIBODY (ROUTINE TESTING W REFLEX)  HEPATITIS PANEL, ACUTE  RPR  POC URINE PREG, ED   ____________________________________________  EKG  ____________________________________________  RADIOLOGY  ED MD interpretation:   Official radiology report(s): No results found.  ____________________________________________   PROCEDURES  Procedure(s) performed (including Critical Care):  Procedures   ____________________________________________   INITIAL IMPRESSION / ASSESSMENT AND PLAN / ED COURSE        Patient presents  with post history exam for assessment of some painful bumps around her vagina as well as some pain with urination and some abnormal vaginal discharge.  She is sexually active.  On arrival she is afebrile hemodynamically stable.  On exam she does have some vesicular lesions as well as some apparently ulcerated lesions.  No fluctuance or hematoma or other clear skin changes.  Unfortunate she is able to tolerate complete pelvic exam at this time.  However she did self swab for wet prep and GC studies.  Exam is most concerning for HSV herpes infection.  She is amenable to undergoing testing for other STDs and this HIV, hepatitis and RPR were also sent.  Advised patient to talk to follow-up with her PCP for this.  Given she is endorsing some abnormal discharge as well we will treat for possible cystitis with Rocephin and Doxy.  Pregnancy test is negative.  UA is concerning for possible cystitis with moderate leukocyte esterase and greater than 50 WBCs.  Urine culture sent.  Wet prep is negative.  I do not believe patient is septic and I have a low suspicion for TOA at this time.  I think she stable for discharge with close outpatient follow-up.    Unfortunately patient did elope prior to receiving her Rocephin IM or blood draws noted above.  I think she was in stable condition when she did elope.         ____________________________________________   FINAL CLINICAL IMPRESSION(S) / ED DIAGNOSES  Final diagnoses:  HSV (herpes simplex virus) anogenital infection  Acute cystitis without hematuria  STD (sexually transmitted disease)    Medications  cefTRIAXone (ROCEPHIN) injection 500 mg (has no administration in time range)     ED Discharge Orders          Ordered    valACYclovir (VALTREX) 1000 MG tablet  2 times daily        11/04/20 1536  doxycycline (VIBRAMYCIN) 100 MG capsule  2 times daily        11/04/20 1536             Note:  This document was prepared using Dragon voice  recognition software and may include unintentional dictation errors.    Gilles Chiquito, MD 11/04/20 1600    Gilles Chiquito, MD 11/04/20 [phone]

## 2020-11-05 ENCOUNTER — Other Ambulatory Visit: Payer: Self-pay

## 2020-11-05 ENCOUNTER — Emergency Department
Admission: EM | Admit: 2020-11-05 | Discharge: 2020-11-05 | Disposition: A | Discharge status: Home / Self Care | Payer: Medicare Other | Attending: Emergency Medicine | Admitting: Emergency Medicine

## 2020-11-05 ENCOUNTER — Encounter: Payer: Self-pay | Admitting: Emergency Medicine

## 2020-11-05 DIAGNOSIS — A549 Gonococcal infection, unspecified: Secondary | ICD-10-CM | POA: Diagnosis not present

## 2020-11-05 DIAGNOSIS — A6004 Herpesviral vulvovaginitis: Secondary | ICD-10-CM | POA: Insufficient documentation

## 2020-11-05 DIAGNOSIS — R300 Dysuria: Secondary | ICD-10-CM | POA: Diagnosis present

## 2020-11-05 LAB — URINE CULTURE: Culture: <10000 — AB

## 2020-11-05 MED ORDER — CEFTRIAXONE SODIUM 1 G IJ SOLR
500.0000 mg | Freq: Once | INTRAMUSCULAR | Status: AC
  Administered 2020-11-05: 500 mg via INTRAMUSCULAR
  Filled 2020-11-05: qty 10

## 2020-11-05 MED ORDER — LIDOCAINE HCL (PF) 1 % IJ SOLN: 2.0000 mL | Freq: Once | INTRAMUSCULAR | Status: DC

## 2020-11-05 MED ORDER — LIDOCAINE HCL (PF) 1 % IJ SOLN
INTRAMUSCULAR | Status: AC
  Filled 2020-11-05: qty 5

## 2020-11-05 NOTE — ED Provider Notes (Signed)
[facility] Emergency Department Provider Note  ____________________________________________   Event Date/Time   First MD Initiated Contact with Patient 11/05/20 1246     (approximate)  I have reviewed the triage vital signs and the nursing notes.   HISTORY  Chief Complaint Vaginal Bleeding and Rectal Bleeding    HPI Abigail Curry is a 25 y.o. female presents emergency department stating that she was here yesterday for vaginal bleeding/burning when she pees.  States she had to leave to go pick her child up and did not get her results.  She denies any fever or chills.  No changes to her status from yesterday.  Past Medical History:  Diagnosis Date   Deaf, right birth   Hard of hearing    LEFT EAR    Patient Active Problem List   Diagnosis Date Noted   Limited prenatal care in third trimester    Late prenatal care    Indication for care in labor and delivery, antepartum 08/21/2019   Indication for care or intervention in labor or delivery 08/21/2019   Placenta abruptio, antepartum, third trimester 08/21/2019   Placental abruption in third trimester    Vaginal bleeding during pregnancy    Cesarean delivery delivered    History of marijuana use 05/14/2019   Rubella non-immune status, antepartum 05/08/2019   Maternal varicella, non-immune 05/08/2019   History of chlamydia 04/18/2019   History of trichomoniasis 04/18/2019   Bilateral sensorineural hearing loss 08/11/2012    Past Surgical History:  Procedure Laterality Date   CESAREAN SECTION  08/21/2019   Procedure: CESAREAN SECTION;  Surgeon: Hildred Laser, MD;  Location: [facility] ORS;  Service: Obstetrics;;   COCHLEAR IMPLANT     DIAGNOSTIC LAPAROSCOPY WITH REMOVAL OF ECTOPIC PREGNANCY Left 05/10/2018   Procedure: DIAGNOSTIC LAPAROSCOPY WITH REMOVAL OF ECTOPIC PREGNANCY;  Surgeon: Christeen Douglas, MD;  Location: [facility] ORS;  Service: Gynecology;  Laterality: Left;    Prior to Admission  medications   Medication Sig Start Date End Date Taking? Authorizing Provider  doxycycline (VIBRAMYCIN) 100 MG capsule Take 1 capsule (100 mg total) by mouth 2 (two) times daily for 14 days. 11/04/20 11/18/20  Gilles Chiquito, MD  valACYclovir (VALTREX) 1000 MG tablet Take 2 tablets (2,000 mg total) by mouth 2 (two) times daily for 7 days. 11/04/20 11/11/20  Gilles Chiquito, MD    Allergies Patient has no known allergies.  Family History  Problem Relation Age of Onset   Diabetes Mother    Sickle cell anemia Brother    Breast cancer Neg Hx    Ovarian cancer Neg Hx    Colon cancer Neg Hx     Social History Social History   Tobacco Use   Smoking status: Never   Smokeless tobacco: Never  Vaping Use   Vaping Use: Every day  Substance Use Topics   Alcohol use: Not Currently   Drug use: Not Currently    Review of Systems  Constitutional: No fever/chills Eyes: No visual changes. ENT: No sore throat. Respiratory: Denies cough Cardiovascular: Denies chest pain Gastrointestinal: Denies abdominal pain Genitourinary: Positive for dysuria. Musculoskeletal: Negative for back pain. Skin: Negative for rash. Psychiatric: no mood changes,     ____________________________________________   PHYSICAL EXAM:  VITAL SIGNS: ED Triage Vitals [11/05/20 1116]  Enc Vitals Group     BP 109/76     Pulse Rate 83     Resp 20     Temp 97.9 F (36.6 C)     Temp  Source Oral     SpO2 97 %     Weight 163 lb (73.9 kg)     Height 5\' 7"  (1.702 m)     Head Circumference      Peak Flow      Pain Score 10     Pain Loc      Pain Edu?      Excl. in GC?     Constitutional: Alert and oriented. Well appearing and in no acute distress. Eyes: Conjunctivae are normal.  Head: Atraumatic. Nose: No congestion/rhinnorhea. Mouth/Throat: Mucous membranes are moist.   Neck:  supple no lymphadenopathy noted Cardiovascular: Normal rate, regular rhythm. Heart sounds are normal Respiratory: Normal  respiratory effort.  No retractions, lungs c t a  GU: deferred by the patient Musculoskeletal: FROM all extremities, warm and well perfused Neurologic:  Normal speech and language.  Skin:  Skin is warm, dry and intact. No rash noted. Psychiatric: Mood and affect are normal. Speech and behavior are normal.  ____________________________________________   LABS (all labs ordered are listed, but only abnormal results are displayed)  Labs Reviewed - No data to display ____________________________________________   ____________________________________________  RADIOLOGY    ____________________________________________   PROCEDURES  Procedure(s) performed: Rocephin 500 mg IM  Procedures    ____________________________________________   INITIAL IMPRESSION / ASSESSMENT AND PLAN / ED COURSE  Pertinent labs & imaging results that were available during my care of the patient were reviewed by me and considered in my medical decision making (see chart for details).   The patient's 25 year old female presents to get her STD testing from yesterday.  See HPI.  Physical exam shows patient per stable  On review of yesterday's test results patient was positive for gonorrhea and diagnosed with herpes.  Prescription had been sent to [facility] pharmacy on room [facility].  However she did not get her Rocephin injection yesterday.  Ordered Rocephin 500 mg IM.  Patient is to follow-up with her regular doctor or the [facility] department for HIV and syphilis testing.  Have all partners treated.  Refrain from sex until all partners been treated for 1 week.  Patient is in agreement with treatment plan.  She was discharged in stable condition.  Of note the patient did refuse a pelvic exam as she just had 1 yesterday.     [name] was evaluated in Emergency Department on 11/05/2020 for the symptoms described in the history of present illness. She was evaluated in the context  of the global COVID-19 pandemic, which necessitated consideration that the patient might be at risk for infection with the SARS-CoV-2 virus that causes COVID-19. Institutional protocols and algorithms that pertain to the evaluation of patients at risk for COVID-19 are in a state of rapid change based on information released by regulatory bodies including the CDC and federal and state organizations. These policies and algorithms were followed during the patient's care in the ED.    As part of my medical decision making, I reviewed the following data within the electronic MEDICAL RECORD NUMBER Nursing notes reviewed and incorporated, Old chart reviewed, Notes from prior ED visits, and [place] Controlled Substance Database  ____________________________________________   FINAL CLINICAL IMPRESSION(S) / ED DIAGNOSES  Final diagnoses:  Gonorrhea  Herpes simplex vulvovaginitis      NEW MEDICATIONS STARTED DURING THIS VISIT:  Current Discharge Medication List       Note:  This document was prepared using Dragon voice recognition software and may include unintentional dictation errors.  Faythe Ghee, PA-C 11/05/20 1300    Minna Antis, MD 11/05/20 1459

## 2020-11-05 NOTE — ED Triage Notes (Addendum)
Pt via POV from home. Pt c/o rectal and vaginal bleeding the past 2 days. Pt states that she also has been noticing some bumps on her labia and rectum. Pt states it burns when she pees. Pt was seen yesterday and states she had to leave to pick up her children. Pt is A&Ox4 and NAD.   Pt is HOH

## 2020-11-05 NOTE — Discharge Instructions (Signed)
Follow-up with your regular doctor or the [facility] department for HIV and syphilis testing. Notify any partners of your infections of gonorrhea and herpes Partners will need to be treated prior to returning to sexual activity with them we will pass the infection back-and-forth Herpes is a lifetime infection.  It will come and go.  If you are having pain or burning in the area you should take the valacyclovir. Return emergency department if worsening

## 2020-12-05 ENCOUNTER — Encounter: Payer: Self-pay | Admitting: Certified Nurse Midwife

## 2020-12-05 ENCOUNTER — Encounter: Payer: Medicaid Other | Admitting: Certified Nurse Midwife

## 2021-06-04 IMAGING — US US OB LIMITED
1 series · 14 of 20 positions shown · non-contrast
Comparison: none

CLINICAL DATA: Heavy vaginal bleeding. Third trimester pregnancy.
No audible Doppler heart tones.

EXAM:
LIMITED OBSTETRIC ULTRASOUND

[Series 1: us ob comp + 14 wk · 14 of 20 slices shown]
[im 1/20]
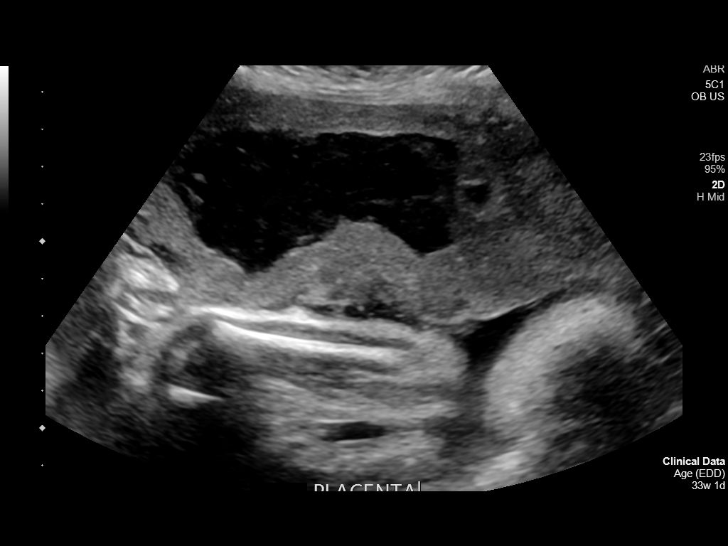
[im 3/20]
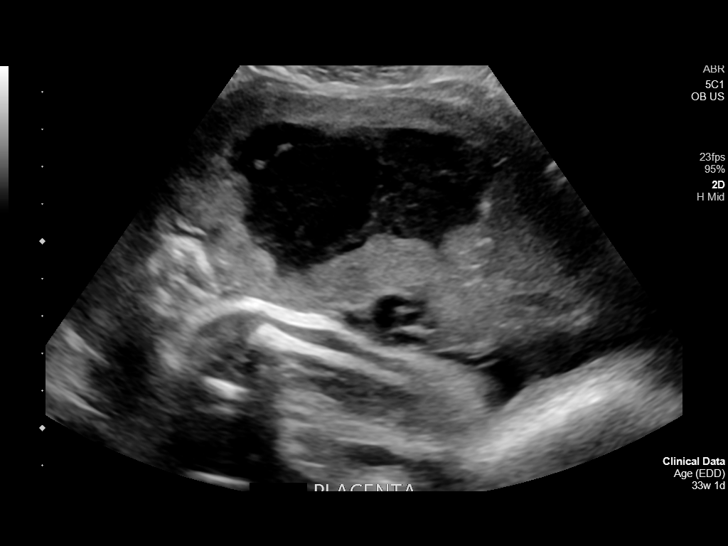
[im 4/20]
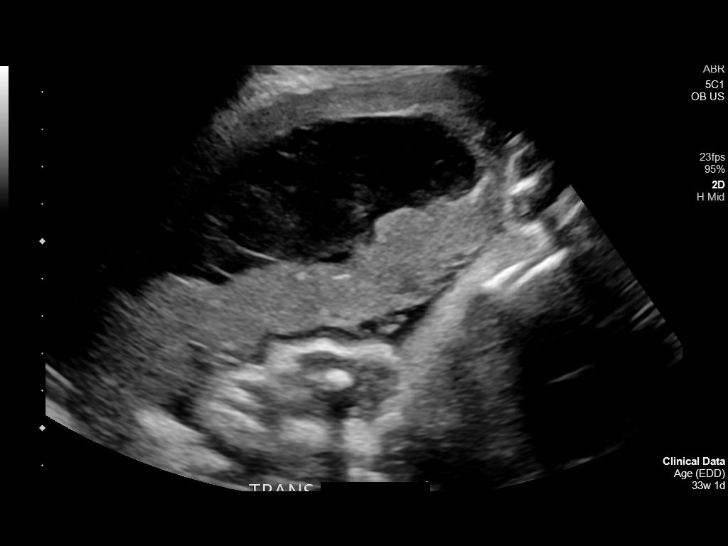
[im 6/20]
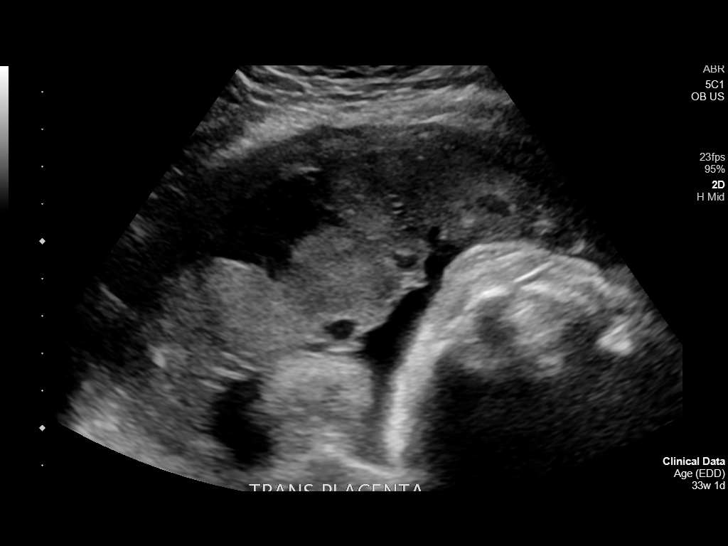
[im 7/20]
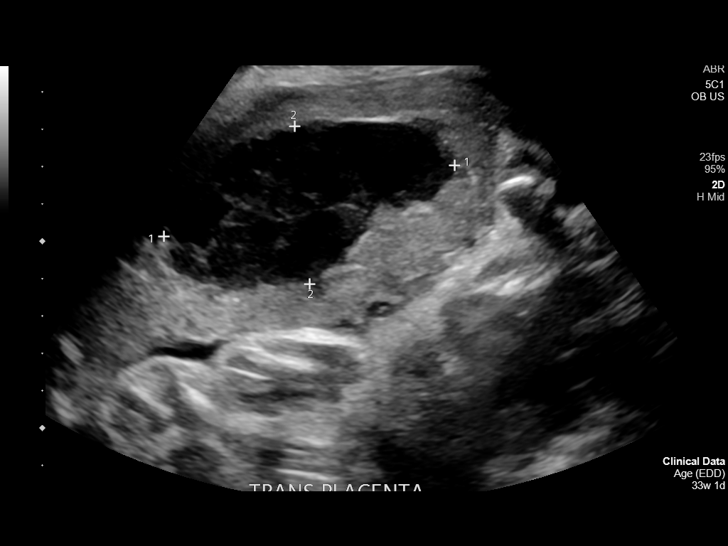
[im 8/20]
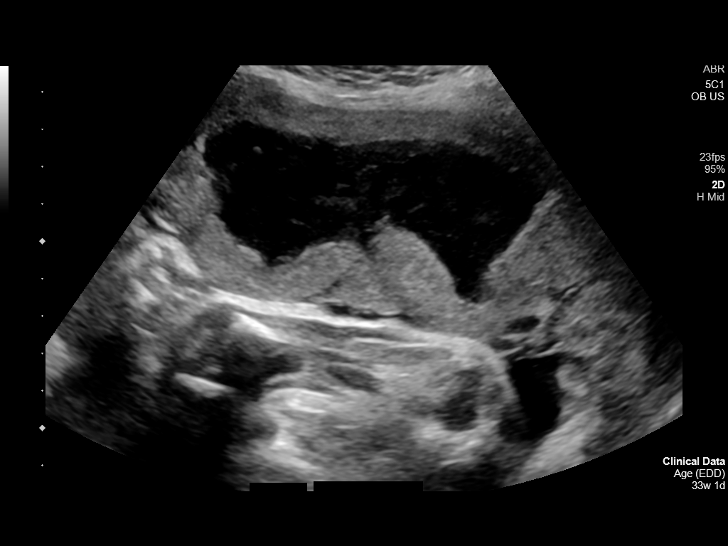
[im 10/20]
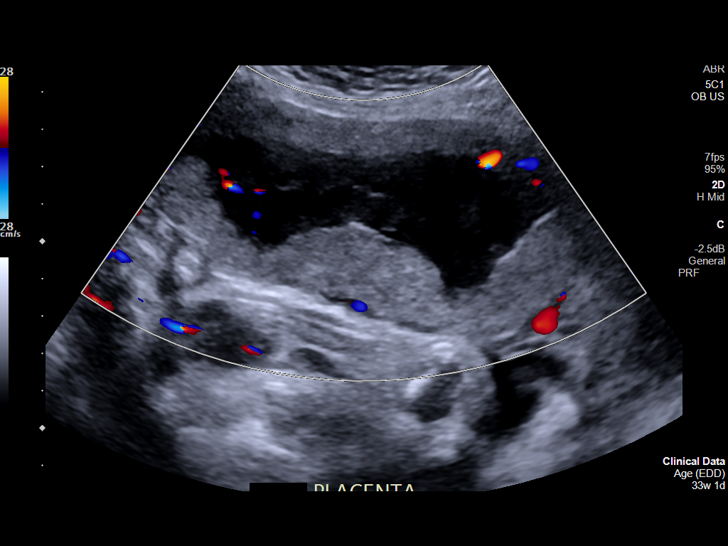
[im 11/20]
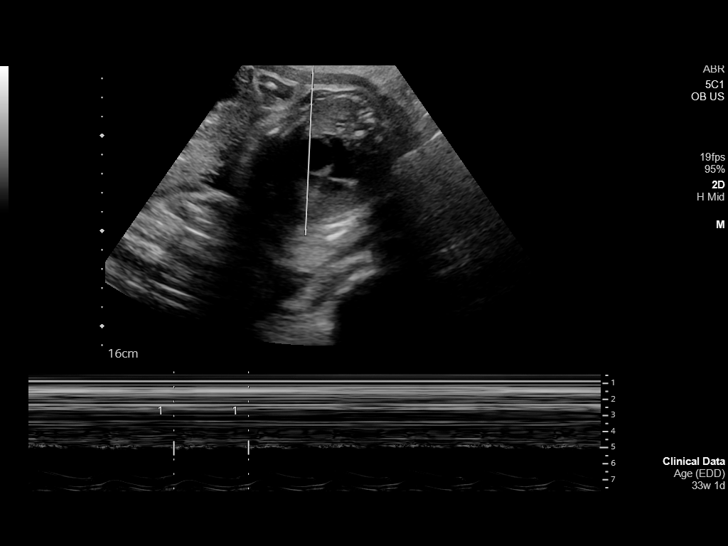
[im 13/20]
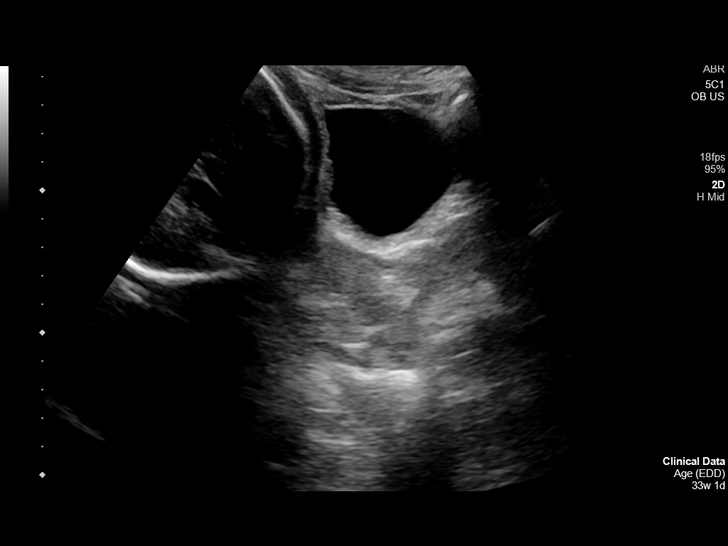
[im 14/20]
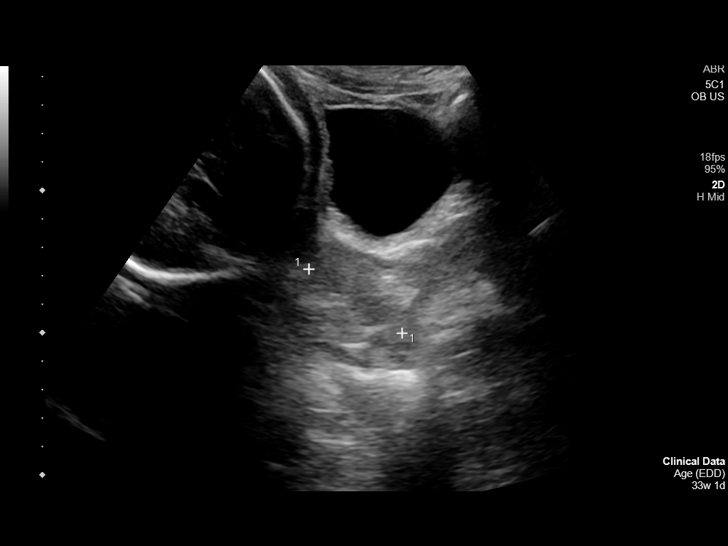
[im 16/20]
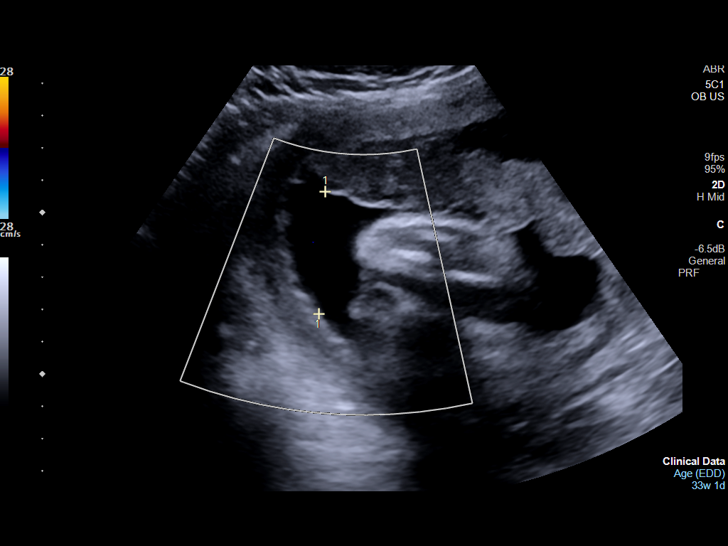
[im 17/20]
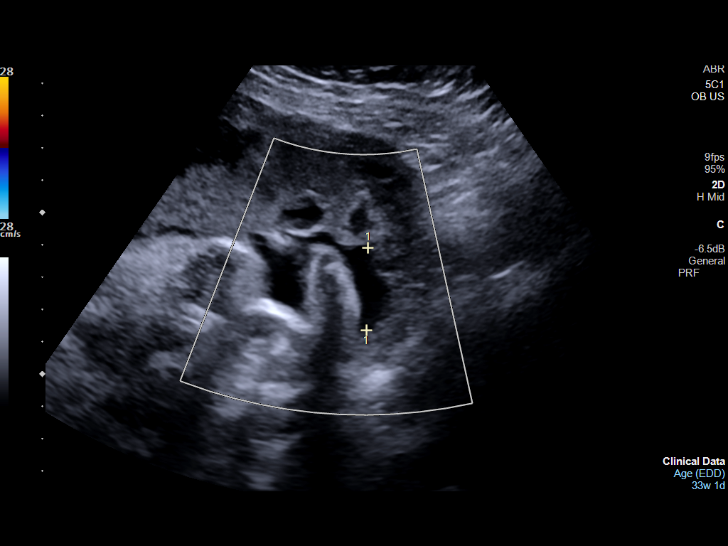
[im 18/20]
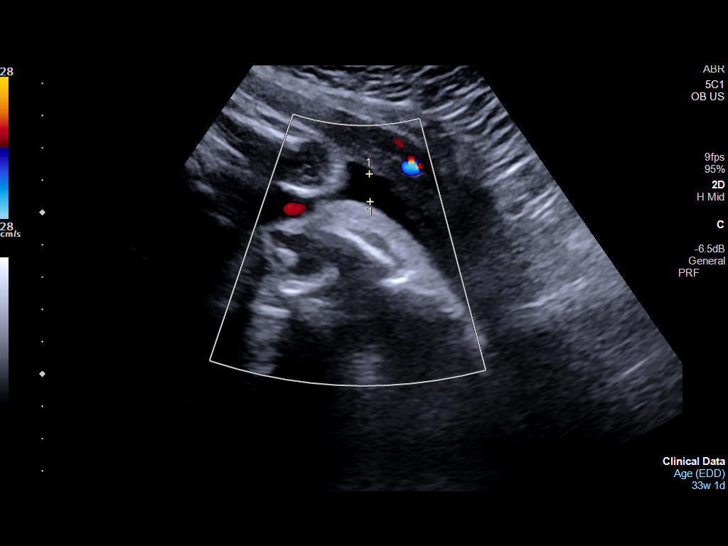
[im 20/20]
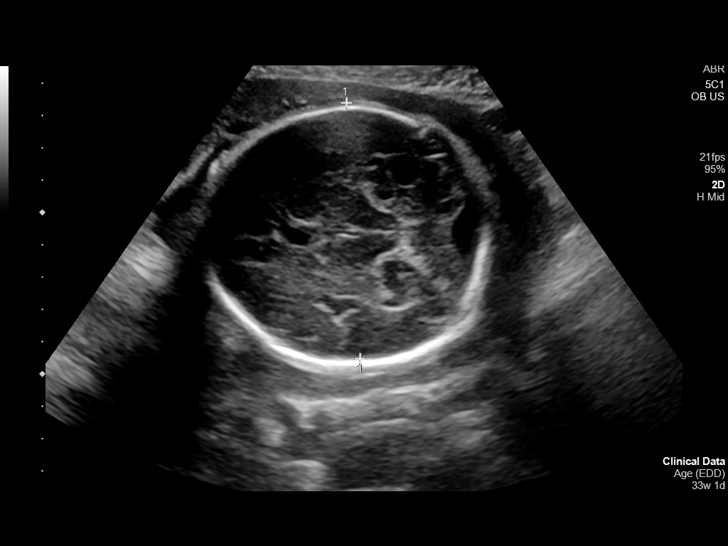

[14 of 20 positions shown; findings below may reference images not displayed]

FINDINGS: Number of Fetuses: 1

Heart Rate:  137 bpm

Movement: No

Presentation: Cephalic

Placental Location: Anterior

Previa: No

Amniotic Fluid (Subjective):  Within normal limits.

AFI: 9.5 cm

BPD: 7.9 cm 31 w  6 d

MATERNAL FINDINGS:

Cervix:  Appears closed.  Measures 4.0 cm in length TA.

Uterus/Adnexae: A large retro-placental abruption is seen anteriorly
measuring at least 8.0 x 4.3 x 8.8 cm.
IMPRESSION: Single living intrauterine fetus in cephalic presentation.

Large anterior retro-placental abruption. Critical Value/emergent
results were called by telephone at the time of interpretation on
08/21/2019 at [DATE] to provider KENO RHODEN , who verbally
acknowledged these results.

This exam is performed on an emergent basis and does not
comprehensively evaluate fetal size, dating, or anatomy; follow-up
complete OB US should be considered if further fetal assessment is
warranted.

## 2021-07-20 ENCOUNTER — Other Ambulatory Visit: Payer: Self-pay

## 2021-07-20 ENCOUNTER — Emergency Department: Payer: Medicare Other

## 2021-07-20 ENCOUNTER — Encounter: Payer: Self-pay | Admitting: *Deleted

## 2021-07-20 ENCOUNTER — Emergency Department
Admission: EM | Admit: 2021-07-20 | Discharge: 2021-07-20 | Disposition: A | Discharge status: Home / Self Care | Payer: Medicare Other | Attending: Emergency Medicine | Admitting: Emergency Medicine

## 2021-07-20 DIAGNOSIS — O209 Hemorrhage in early pregnancy, unspecified: Secondary | ICD-10-CM | POA: Insufficient documentation

## 2021-07-20 DIAGNOSIS — O4690 Antepartum hemorrhage, unspecified, unspecified trimester: Secondary | ICD-10-CM

## 2021-07-20 DIAGNOSIS — O26891 Other specified pregnancy related conditions, first trimester: Secondary | ICD-10-CM | POA: Diagnosis not present

## 2021-07-20 DIAGNOSIS — R102 Pelvic and perineal pain: Secondary | ICD-10-CM | POA: Insufficient documentation

## 2021-07-20 DIAGNOSIS — Z3A01 Less than 8 weeks gestation of pregnancy: Secondary | ICD-10-CM | POA: Diagnosis not present

## 2021-07-20 DIAGNOSIS — Z6740 Type O blood, Rh positive: Secondary | ICD-10-CM | POA: Diagnosis not present

## 2021-07-20 LAB — CBC
HCT: 40.9 % (ref 36.0–46.0)
Hemoglobin: 13.2 g/dL (ref 12.0–15.0)
MCH: 30.3 pg (ref 26.0–34.0)
MCHC: 32.3 g/dL (ref 30.0–36.0)
MCV: 93.8 fL (ref 80.0–100.0)
Platelets: 204 10*3/uL (ref 150–400)
RBC: 4.36 MIL/uL (ref 3.87–5.11)
RDW: 14.6 % (ref 11.5–15.5)
WBC: 8.3 10*3/uL (ref 4.0–10.5)
nRBC: 0 % (ref 0.0–0.2)

## 2021-07-20 LAB — URINALYSIS, ROUTINE W REFLEX MICROSCOPIC
Bacteria, UA: NONE SEEN
Bilirubin Urine: NEGATIVE
Glucose, UA: NEGATIVE mg/dL
Ketones, ur: NEGATIVE mg/dL
Nitrite: NEGATIVE
Protein, ur: NEGATIVE mg/dL
Specific Gravity, Urine: 1.021 (ref 1.005–1.030)
pH: 5 (ref 5.0–8.0)

## 2021-07-20 LAB — COMPREHENSIVE METABOLIC PANEL
ALT: 13 U/L (ref 0–44)
AST: 14 U/L — ABNORMAL LOW (ref 15–41)
Albumin: 4.1 g/dL (ref 3.5–5.0)
Alkaline Phosphatase: 42 U/L (ref 38–126)
Anion gap: 6 (ref 5–15)
BUN: 9 mg/dL (ref 6–20)
CO2: 27 mmol/L (ref 22–32)
Calcium: 9.4 mg/dL (ref 8.9–10.3)
Chloride: 105 mmol/L (ref 98–111)
Creatinine, Ser: 0.5 mg/dL (ref 0.44–1.00)
GFR, Estimated: >60 mL/min (ref >60)
Glucose, Bld: 129 mg/dL — ABNORMAL HIGH (ref 70–99)
Potassium: 3.3 mmol/L — ABNORMAL LOW (ref 3.5–5.1)
Sodium: 138 mmol/L (ref 135–145)
Total Bilirubin: 0.9 mg/dL (ref 0.3–1.2)
Total Protein: 7.2 g/dL (ref 6.5–8.1)

## 2021-07-20 LAB — POC URINE PREG, ED: Preg Test, Ur: POSITIVE — AB

## 2021-07-20 LAB — HCG, QUANTITATIVE, PREGNANCY: hCG, Beta Chain, Quant, S: 9634 m[IU]/mL — ABNORMAL HIGH (ref <5)

## 2021-07-20 LAB — ABO/RH: ABO/RH(D): O POS

## 2021-07-20 NOTE — ED Notes (Signed)
PT Dc to home. DC instructions reviewed with all questions answered. Pt verbalizes understanding. Pt ambulatory out of dept with steady gait

## 2021-07-20 NOTE — ED Notes (Signed)
Poct pregnancy Positive 

## 2021-07-20 NOTE — ED Triage Notes (Signed)
Pt reports vaginal bleeding  pt is approx 2 months pregnant.  Pt reports abd pain last night.  Pt alert.

## 2021-07-20 NOTE — ED Provider Notes (Signed)
[facility] Provider Note    None    (approximate)   History   Vaginal Bleeding   HPI  Abigail Curry is a 26 y.o. female presents to the emergency department for treatment and evaluation of vaginal bleeding.  She believes she is about 2 months pregnant.  Yesterday, she started having some vaginal bleeding that has continued throughout the day.  She is not passing clots or tissue.  Gravida 4 para 2.  She has some mild pelvic cramping last night like a normal menstrual cycle but the cramping has since passed.  Past Medical History:  Diagnosis Date   Deaf, right birth   Hard of hearing    LEFT EAR      Physical Exam   Triage Vital Signs: ED Triage Vitals  Enc Vitals Group     BP 07/20/21 1827 99/64     Pulse Rate 07/20/21 1827 73     Resp 07/20/21 1827 17     Temp 07/20/21 1827 98.7 F (37.1 C)     Temp Source 07/20/21 1827 Oral     SpO2 07/20/21 1827 96 %     Weight 07/20/21 1836 163 lb (73.9 kg)     Height 07/20/21 1836 5\' 7"  (1.702 m)     Head Circumference --      Peak Flow --      Pain Score 07/20/21 1835 0     Pain Loc --      Pain Edu? --      Excl. in GC? --     Most recent vital signs: Vitals:   07/20/21 1827 07/20/21 2137  BP: 99/64 110/64  Pulse: 73 77  Resp: 17 17  Temp: 98.7 F (37.1 C) 98.5 F (36.9 C)  SpO2: 96% 99%    General: Awake, no distress.  CV:  Good peripheral perfusion.  Resp:  Normal effort.  Abd:  No distention.  Other:     ED Results / Procedures / Treatments   Labs (all labs ordered are listed, but only abnormal results are displayed) Labs Reviewed  COMPREHENSIVE METABOLIC PANEL - Abnormal; Notable for the following components:      Result Value   Potassium 3.3 (*)    Glucose, Bld 129 (*)    AST 14 (*)    All other components within normal limits  URINALYSIS, ROUTINE W REFLEX MICROSCOPIC - Abnormal; Notable for the following components:   Color, Urine YELLOW (*)    APPearance HAZY  (*)    Hgb urine dipstick LARGE (*)    Leukocytes,Ua TRACE (*)    All other components within normal limits  HCG, QUANTITATIVE, PREGNANCY - Abnormal; Notable for the following components:   hCG, Beta Chain, Quant, S 2138 (*)    All other components within normal limits  POC URINE PREG, ED - Abnormal; Notable for the following components:   Preg Test, Ur POSITIVE (*)    All other components within normal limits  CBC  ABO/RH     EKG  Not indicated   RADIOLOGY  Single intrauterine gestational sac noted with no yolk sac, embryo visualized.  I have independently reviewed and interpreted imaging as well as reviewed report from radiology.  PROCEDURES:  Critical Care performed: No  Procedures   MEDICATIONS ORDERED IN ED:  Medications - No data to display   IMPRESSION / MDM / ASSESSMENT AND PLAN / ED COURSE   I reviewed the triage vital signs and the nursing notes.  Differential diagnosis includes, but is not limited to: Single IUP, vaginal bleeding in early pregnancy, missed abortion, miscarriage  Patient's presentation is most consistent with acute presentation with potential threat to life or bodily function.  26 year old female presenting to the emergency department for treatment and evaluation of vaginal bleeding in pregnancy.  See HPI for further details.  Labs are overall reassuring.  Beta hCG is elevated at 9634.  ABO/Rh is O+.  CBC and CMP is negative for acute concerns.  Ultrasound shows a single intrauterine gestational sac but no embryo.  Lab results and ultrasound results discussed with the patient.  She states that she has an upcoming appointment with [facility] but does not recall the date.  She was encouraged to call Monday to request an earlier appointment/follow-up ultrasound and labs.  ER return precautions were discussed as well.     FINAL CLINICAL IMPRESSION(S) / ED DIAGNOSES   Final diagnoses:  Vaginal bleeding in  pregnancy     Rx / DC Orders   ED Discharge Orders     None        Note:  This document was prepared using Dragon voice recognition software and may include unintentional dictation errors.   Chinita Pester, FNP 07/20/21 2157    Sharman Cheek, MD 07/21/21 1550

## 2021-07-20 NOTE — Discharge Instructions (Signed)
Do not use tampons or have intercourse while you are still bleeding.  Call [facility] on Monday to request an earlier appointment.  Return to the ER for symptoms of concern.

## 2022-08-27 ENCOUNTER — Ambulatory Visit: Payer: Medicare Other | Admitting: Family Medicine

## 2022-08-27 NOTE — Progress Notes (Deleted)
      Established patient visit   Patient: Abigail Curry   DOB: 07/18/1995   27 y.o. Female  MRN: [id number] Visit Date: 08/27/2022  Today's healthcare provider: Sherlyn Hay, DO   No chief complaint on file.  Subjective    HPI  Sign language interpreter used:       Wants HIV/HCV testing/screening?  ***  Medications: No outpatient medications prior to visit.   No facility-administered medications prior to visit.    Review of Systems  {Labs  Heme  Chem  Endocrine  Serology  Results Review (optional):23779}   Objective    There were no vitals taken for this visit. {Show previous vital signs (optional):23777}  Physical Exam  ***  No results found for any visits on 08/27/22.  Assessment & Plan     ***  No follow-ups on file.      {provider attestation***:1}   Sherlyn Hay, DO  [facility] [phone] (phone) [phone] (fax)  [facility]

## 2022-10-30 ENCOUNTER — Ambulatory Visit (INDEPENDENT_AMBULATORY_CARE_PROVIDER_SITE_OTHER): Payer: Medicare Other | Admitting: Obstetrics & Gynecology

## 2022-10-30 ENCOUNTER — Other Ambulatory Visit (HOSPITAL_COMMUNITY)
Admission: RE | Admit: 2022-10-30 | Discharge: 2022-10-30 | Disposition: A | Discharge status: Home / Self Care | Payer: Medicare Other | Source: Ambulatory Visit | Attending: Obstetrics & Gynecology | Admitting: Obstetrics & Gynecology

## 2022-10-30 ENCOUNTER — Encounter: Payer: Self-pay | Admitting: Family Medicine

## 2022-10-30 ENCOUNTER — Encounter: Payer: Self-pay | Admitting: Obstetrics & Gynecology

## 2022-10-30 VITALS — BP 98/56 | HR 91 | Ht 67.0 in | Wt 147.0 lb

## 2022-10-30 DIAGNOSIS — N898 Other specified noninflammatory disorders of vagina: Secondary | ICD-10-CM | POA: Diagnosis present

## 2022-10-30 DIAGNOSIS — Z3481 Encounter for supervision of other normal pregnancy, first trimester: Secondary | ICD-10-CM | POA: Diagnosis present

## 2022-10-30 DIAGNOSIS — N760 Acute vaginitis: Secondary | ICD-10-CM

## 2022-10-30 DIAGNOSIS — Z98891 History of uterine scar from previous surgery: Secondary | ICD-10-CM | POA: Insufficient documentation

## 2022-10-30 DIAGNOSIS — A749 Chlamydial infection, unspecified: Secondary | ICD-10-CM

## 2022-10-30 DIAGNOSIS — N912 Amenorrhea, unspecified: Secondary | ICD-10-CM | POA: Diagnosis not present

## 2022-10-30 DIAGNOSIS — B3731 Acute candidiasis of vulva and vagina: Secondary | ICD-10-CM

## 2022-10-30 DIAGNOSIS — Z3201 Encounter for pregnancy test, result positive: Secondary | ICD-10-CM | POA: Diagnosis not present

## 2022-10-30 DIAGNOSIS — N926 Irregular menstruation, unspecified: Secondary | ICD-10-CM

## 2022-10-30 LAB — POCT URINE PREGNANCY: Preg Test, Ur: POSITIVE — AB

## 2022-10-30 LAB — OB RESULTS CONSOLE VARICELLA ZOSTER ANTIBODY, IGG: Varicella: IMMUNE

## 2022-10-30 NOTE — Progress Notes (Signed)
    GYNECOLOGY PROGRESS NOTE  Subjective:    Patient ID: Abigail Curry, female    DOB: [birth year], 27 y.o.   MRN: [id number]  HPI  Patient is a 27 y.o. [id number] female who presents for pregnancy confirmation. She hasn't started taking PNVs yet. Her history is significant for a h/o CT, trich, ectopic pregnancy, abruption requiring Cesarean section with last baby.    The following portions of the patient's history were reviewed and updated as appropriate: allergies, current medications, past family history, past medical history, past social history, past surgical history, and problem list.  Review of Systems Pertinent items are noted in HPI.  She has some hearing loss. This FOB is not the FOB of her other kids. Pap smear normal in 2022  Objective:   Blood pressure (!) 98/56, pulse 91, height 5\' 7"  (1.702 m), weight 147 lb (66.7 kg), last menstrual period 08/20/2022, unknown if currently breastfeeding. Body mass index is 23.02 kg/m. Well nourished, well hydrated White female, no apparent distress She is ambulating and conversing normally. Abd- benign Bedside ultrasound shows a singleton fetus with good movement and a FHR of 150s. I do no see any adnexal masses. Spec exam reveals a frothy white discharge and a closed cervix   Assessment:   1. Missed period   2. History of cesarean delivery   3.      History of ectopic pregnancy 4.      History of CT and trich  Plan:   1. Missed period  - POCT urine pregnancy  2. History of cesarean delivery - She would like a TOLAC. Her first baby was delivered vaginally  NOB labs today Schedule NOB intake and exam and dating ultrasound.  3. Vaginal discharge- [facility] sent for everything

## 2022-10-30 NOTE — Addendum Note (Signed)
Addended by: Cornelius Moras D on: 10/30/2022 11:01 AM   Modules accepted: Orders

## 2022-10-31 ENCOUNTER — Encounter: Payer: Self-pay | Admitting: Family Medicine

## 2022-10-31 LAB — CBC/D/PLT+RPR+RH+ABO+RUBIGG...
Antibody Screen: NEGATIVE
Basophils Absolute: 0 10*3/uL (ref 0.0–0.2)
Basos: 0 %
EOS (ABSOLUTE): 0.1 10*3/uL (ref 0.0–0.4)
Eos: 1 %
HCV Ab: NONREACTIVE
HIV Screen 4th Generation wRfx: NONREACTIVE
Hematocrit: 39.6 % (ref 34.0–46.6)
Hemoglobin: 12.9 g/dL (ref 11.1–15.9)
Hepatitis B Surface Ag: NEGATIVE
Immature Grans (Abs): 0.1 10*3/uL (ref 0.0–0.1)
Immature Granulocytes: 1 %
Lymphocytes Absolute: 1.3 10*3/uL (ref 0.7–3.1)
Lymphs: 13 %
MCH: 31.6 pg (ref 26.6–33.0)
MCHC: 32.6 g/dL (ref 31.5–35.7)
MCV: 97 fL (ref 79–97)
Monocytes Absolute: 0.5 10*3/uL (ref 0.1–0.9)
Monocytes: 6 %
Neutrophils Absolute: 7.7 10*3/uL — ABNORMAL HIGH (ref 1.4–7.0)
Neutrophils: 79 %
Platelets: 360 10*3/uL (ref 150–450)
RBC: 4.08 x10E6/uL (ref 3.77–5.28)
RDW: 14 % (ref 11.7–15.4)
RPR Ser Ql: NONREACTIVE
Rh Factor: POSITIVE
Rubella Antibodies, IGG: 3.48 {index} (ref >0.99)
Varicella zoster IgG: 249 {index} (ref >165)
WBC: 9.7 10*3/uL (ref 3.4–10.8)

## 2022-10-31 LAB — CERVICOVAGINAL ANCILLARY ONLY
Bacterial Vaginitis (gardnerella): POSITIVE — AB
Candida Glabrata: NEGATIVE
Candida Vaginitis: POSITIVE — AB
Chlamydia: POSITIVE — AB
Comment: NEGATIVE
Comment: NEGATIVE
Comment: NEGATIVE
Comment: NEGATIVE
Comment: NEGATIVE
Comment: NORMAL
Neisseria Gonorrhea: NEGATIVE
Trichomonas: POSITIVE — AB

## 2022-10-31 LAB — URINALYSIS, ROUTINE W REFLEX MICROSCOPIC
Bilirubin, UA: NEGATIVE
Glucose, UA: NEGATIVE
Ketones, UA: NEGATIVE
Nitrite, UA: NEGATIVE
RBC, UA: NEGATIVE
Specific Gravity, UA: 1.023 (ref 1.005–1.030)
Urobilinogen, Ur: 0.2 mg/dL (ref 0.2–1.0)
pH, UA: 5.5 (ref 5.0–7.5)

## 2022-10-31 LAB — HCV INTERPRETATION

## 2022-10-31 LAB — MICROSCOPIC EXAMINATION
Bacteria, UA: NONE SEEN
Casts: NONE SEEN /LPF

## 2022-11-02 ENCOUNTER — Telehealth: Payer: Self-pay

## 2022-11-02 MED ORDER — FLUCONAZOLE 150 MG PO TABS
150.0000 mg | ORAL_TABLET | Freq: Once | ORAL | 0 refills | Status: AC
Start: 2022-11-02 — End: 2022-11-02

## 2022-11-02 MED ORDER — METRONIDAZOLE 500 MG PO TABS
500.0000 mg | ORAL_TABLET | Freq: Two times a day (BID) | ORAL | 1 refills | Status: AC
Start: 2022-11-02 — End: ?

## 2022-11-02 MED ORDER — AZITHROMYCIN 500 MG PO TABS
1000.0000 mg | ORAL_TABLET | Freq: Once | ORAL | 1 refills | Status: AC
Start: 2022-11-02 — End: 2022-11-02

## 2022-11-02 NOTE — Telephone Encounter (Signed)
Called pt to advise of lab results, Pt aware of Rx sent to pharmacy, start Chlamydia and Yeast meds today and Trich and BV meds tomorrow, No intercourse without condom for 2 weeks, also partner needs to be treated.

## 2022-11-02 NOTE — Addendum Note (Signed)
Addended by: Cornelius Moras D on: 11/02/2022 10:44 AM   Modules accepted: Orders

## 2022-11-03 LAB — MATERNIT 21 PLUS CORE, BLOOD
Fetal Fraction: 11
Result (T21): NEGATIVE
Trisomy 13 (Patau syndrome): NEGATIVE
Trisomy 18 (Edwards syndrome): NEGATIVE
Trisomy 21 (Down syndrome): NEGATIVE

## 2022-11-06 ENCOUNTER — Ambulatory Visit
Admission: RE | Admit: 2022-11-06 | Discharge: 2022-11-06 | Disposition: A | Discharge status: Home / Self Care | Payer: Medicare Other | Source: Ambulatory Visit | Attending: Obstetrics & Gynecology | Admitting: Obstetrics & Gynecology

## 2022-11-06 DIAGNOSIS — Z3687 Encounter for antenatal screening for uncertain dates: Secondary | ICD-10-CM | POA: Insufficient documentation

## 2022-11-06 DIAGNOSIS — Z3481 Encounter for supervision of other normal pregnancy, first trimester: Secondary | ICD-10-CM | POA: Insufficient documentation

## 2022-11-06 DIAGNOSIS — Z3A12 12 weeks gestation of pregnancy: Secondary | ICD-10-CM | POA: Diagnosis not present

## 2022-11-12 ENCOUNTER — Ambulatory Visit: Payer: Medicare Other

## 2022-11-14 ENCOUNTER — Ambulatory Visit: Payer: Medicare Other

## 2022-11-16 ENCOUNTER — Ambulatory Visit (INDEPENDENT_AMBULATORY_CARE_PROVIDER_SITE_OTHER): Payer: Medicare Other

## 2022-11-16 DIAGNOSIS — Z3480 Encounter for supervision of other normal pregnancy, unspecified trimester: Secondary | ICD-10-CM | POA: Insufficient documentation

## 2022-11-16 DIAGNOSIS — Z3689 Encounter for other specified antenatal screening: Secondary | ICD-10-CM

## 2022-11-16 NOTE — Patient Instructions (Signed)
Second Trimester of Pregnancy  The second trimester of pregnancy is from week 13 through week 27. This is months 4 through 6 of pregnancy. The second trimester is often a time when you feel your best. Your body has adjusted to being pregnant, and you begin to feel better physically. During the second trimester: Morning sickness has lessened or stopped completely. You may have more energy. You may have an increase in appetite. The second trimester is also a time when the unborn baby (fetus) is growing rapidly. At the end of the sixth month, the fetus may be up to 12 inches long and weigh about 1 pounds. You will likely begin to feel the baby move (quickening) between 16 and 20 weeks of pregnancy. Body changes during your second trimester Your body continues to go through many changes during your second trimester. The changes vary and generally return to normal after the baby is born. Physical changes Your weight will continue to increase. You will notice your lower abdomen bulging out. You may begin to get stretch marks on your hips, abdomen, and breasts. Your breasts will continue to grow and to become tender. Dark spots or blotches (chloasma or mask of pregnancy) may develop on your face. A dark line from your belly button to the pubic area (linea nigra) may appear. You may have changes in your hair. These can include thickening of your hair, rapid growth, and changes in texture. Some people also have hair loss during or after pregnancy, or hair that feels dry or thin. Health changes You may develop headaches. You may have heartburn. You may develop constipation. You may develop hemorrhoids or swollen, bulging veins (varicose veins). Your gums may bleed and may be sensitive to brushing and flossing. You may urinate more often because the fetus is pressing on your bladder. You may have back pain. This is caused by: Weight gain. Pregnancy hormones that are relaxing the joints in your  pelvis. A shift in weight and the muscles that support your balance. Follow these instructions at home: Medicines Follow your health care provider's instructions regarding medicine use. Specific medicines may be either safe or unsafe to take during pregnancy. Do not take any medicines unless approved by your health care provider. Take a prenatal vitamin that contains at least 600 micrograms (mcg) of folic acid. Eating and drinking Eat a healthy diet that includes fresh fruits and vegetables, whole grains, good sources of protein such as meat, eggs, or tofu, and low-fat dairy products. Avoid raw meat and unpasteurized juice, milk, and cheese. These carry germs that can harm you and your baby. You may need to take these actions to prevent or treat constipation: Drink enough fluid to keep your urine pale yellow. Eat foods that are high in fiber, such as beans, whole grains, and fresh fruits and vegetables. Limit foods that are high in fat and processed sugars, such as fried or sweet foods. Activity Exercise only as directed by your health care provider. Most people can continue their usual exercise routine during pregnancy. Try to exercise for 30 minutes at least 5 days a week. Stop exercising if you develop contractions in your uterus. Stop exercising if you develop pain or cramping in the lower abdomen or lower back. Avoid exercising if it is very hot or humid or if you are at a high altitude. Avoid heavy lifting. If you choose to, you may have sex unless your health care provider tells you not to. Relieving pain and discomfort Wear a supportive   bra to prevent discomfort from breast tenderness. Take warm sitz baths to soothe any pain or discomfort caused by hemorrhoids. Use hemorrhoid cream if your health care provider approves. Rest with your legs raised (elevated) if you have leg cramps or low back pain. If you develop varicose veins: Wear support hose as told by your health care  provider. Elevate your feet for 15 minutes, 3-4 times a day. Limit salt in your diet. Safety Wear your seat belt at all times when driving or riding in a car. Talk with your health care provider if someone is verbally or physically abusive to you. Lifestyle Do not use hot tubs, steam rooms, or saunas. Do not douche. Do not use tampons or scented sanitary pads. Avoid cat litter boxes and soil used by cats. These carry germs that can cause birth defects in the baby and possibly loss of the fetus by miscarriage or stillbirth. Do not use herbal remedies, alcohol, illegal drugs, or medicines that are not approved by your health care provider. Chemicals in these products can harm your baby. Do not use any products that contain nicotine or tobacco, such as cigarettes, e-cigarettes, and chewing tobacco. If you need help quitting, ask your health care provider. General instructions During a routine prenatal visit, your health care provider will do a physical exam and other tests. He or she will also discuss your overall health. Keep all follow-up visits. This is important. Ask your health care provider for a referral to a local prenatal education class. Ask for help if you have counseling or nutritional needs during pregnancy. Your health care provider can offer advice or refer you to specialists for help with various needs. Where to find more information American Pregnancy Association: americanpregnancy.org American College of Obstetricians and Gynecologists: acog.org/en/Womens%20Health/Pregnancy Office on Women's Health: womenshealth.gov/pregnancy Contact a health care provider if you have: A headache that does not go away when you take medicine. Vision changes or you see spots in front of your eyes. Mild pelvic cramps, pelvic pressure, or nagging pain in the abdominal area. Persistent nausea, vomiting, or diarrhea. A bad-smelling vaginal discharge or foul-smelling urine. Pain when you  urinate. Sudden or extreme swelling of your face, hands, ankles, feet, or legs. A fever. Get help right away if you: Have fluid leaking from your vagina. Have spotting or bleeding from your vagina. Have severe abdominal cramping or pain. Have difficulty breathing. Have chest pain. Have fainting spells. Have not felt your baby move for the time period told by your health care provider. Have new or increased pain, swelling, or redness in an arm or leg. Summary The second trimester of pregnancy is from week 13 through week 27 (months 4 through 6). Do not use herbal remedies, alcohol, illegal drugs, or medicines that are not approved by your health care provider. Chemicals in these products can harm your baby. Exercise only as directed by your health care provider. Most people can continue their usual exercise routine during pregnancy. Keep all follow-up visits. This is important. This information is not intended to replace advice given to you by your health care provider. Make sure you discuss any questions you have with your health care provider. Document Revised: 07/15/2019 Document Reviewed: 05/21/2019 Elsevier Patient Education  2024 Else[employer] Inc. First Trimester of Pregnancy  The first trimester of pregnancy starts on the first day of your last menstrual period until the end of week 12. This is also called months 1 through 3 of pregnancy. Body changes during your first trimester Your   body goes through many changes during pregnancy. The changes usually return to normal after your baby is born. Physical changes You may gain or lose weight. Your breasts may grow larger and hurt. The area around your nipples may get darker. Dark spots or blotches may develop on your face. You may have changes in your hair. Health changes You may feel like you might vomit (nauseous), and you may vomit. You may have heartburn. You may have headaches. You may have trouble pooping (constipation). Your  gums may bleed. Other changes You may get tired easily. You may pee (urinate) more often. Your menstrual periods will stop. You may not feel hungry. You may want to eat certain kinds of food. You may have changes in your emotions from day to day. You may have more dreams. Follow these instructions at home: Medicines Take over-the-counter and prescription medicines only as told by your doctor. Some medicines are not safe during pregnancy. Take a prenatal vitamin that contains at least 600 micrograms (mcg) of folic acid. Eating and drinking Eat healthy meals that include: Fresh fruits and vegetables. Whole grains. Good sources of protein, such as meat, eggs, or tofu. Low-fat dairy products. Avoid raw meat and unpasteurized juice, milk, and cheese. If you feel like you may vomit, or you vomit: Eat 4 or 5 small meals a day instead of 3 large meals. Try eating a few soda crackers. Drink liquids between meals instead of during meals. You may need to take these actions to prevent or treat trouble pooping: Drink enough fluids to keep your pee (urine) pale yellow. Eat foods that are high in fiber. These include beans, whole grains, and fresh fruits and vegetables. Limit foods that are high in fat and sugar. These include fried or sweet foods. Activity Exercise only as told by your doctor. Most people can do their usual exercise routine during pregnancy. Stop exercising if you have cramps or pain in your lower belly (abdomen) or low back. Do not exercise if it is too hot or too humid, or if you are in a place of great height (high altitude). Avoid heavy lifting. If you choose to, you may have sex unless your doctor tells you not to. Relieving pain and discomfort Wear a good support bra if your breasts are sore. Rest with your legs raised (elevated) if you have leg cramps or low back pain. If you have bulging veins (varicose veins) in your legs: Wear support hose as told by your  doctor. Raise your feet for 15 minutes, 3-4 times a day. Limit salt in your food. Safety Wear your seat belt at all times when you are in a car. Talk with your doctor if someone is hurting you or yelling at you. Talk with your doctor if you are feeling sad or have thoughts of hurting yourself. Lifestyle Do not use hot tubs, steam rooms, or saunas. Do not douche. Do not use tampons or scented sanitary pads. Do not use herbal medicines, illegal drugs, or medicines that are not approved by your doctor. Do not drink alcohol. Do not smoke or use any products that contain nicotine or tobacco. If you need help quitting, ask your doctor. Avoid cat litter boxes and soil that is used by cats. These carry germs that can cause harm to the baby and can cause a loss of your baby by miscarriage or stillbirth. General instructions Keep all follow-up visits. This is important. Ask for help if you need counseling or if you need help with   nutrition. Your doctor can give you advice or tell you where to go for help. Visit your dentist. At home, brush your teeth with a soft toothbrush. Floss gently. Write down your questions. Take them to your prenatal visits. Where to find more information American Pregnancy Association: americanpregnancy.org American College of Obstetricians and Gynecologists: www.acog.org Office on Women's Health: womenshealth.gov/pregnancy Contact a doctor if: You are dizzy. You have a fever. You have mild cramps or pressure in your lower belly. You have a nagging pain in your belly area. You continue to feel like you may vomit, you vomit, or you have watery poop (diarrhea) for 24 hours or longer. You have a bad-smelling fluid coming from your vagina. You have pain when you pee. You are exposed to a disease that spreads from person to person, such as chickenpox, measles, Zika virus, HIV, or hepatitis. Get help right away if: You have spotting or bleeding from your vagina. You have  very bad belly cramping or pain. You have shortness of breath or chest pain. You have any kind of injury, such as from a fall or a car crash. You have new or increased pain, swelling, or redness in an arm or leg. Summary The first trimester of pregnancy starts on the first day of your last menstrual period until the end of week 12 (months 1 through 3). Eat 4 or 5 small meals a day instead of 3 large meals. Do not smoke or use any products that contain nicotine or tobacco. If you need help quitting, ask your doctor. Keep all follow-up visits. This information is not intended to replace advice given to you by your health care provider. Make sure you discuss any questions you have with your health care provider. Document Revised: 07/15/2019 Document Reviewed: 05/21/2019 Elsevier Patient Education  2024 Elsevier Inc. Commonly Asked Questions During Pregnancy  Cats: A parasite can be excreted in cat feces.  To avoid exposure you need to have another person empty the little box.  If you must empty the litter box you will need to wear gloves.  Wash your hands after handling your cat.  This parasite can also be found in raw or undercooked meat so this should also be avoided.  Colds, Sore Throats, Flu: Please check your medication sheet to see what you can take for symptoms.  If your symptoms are unrelieved by these medications please call the office.  Dental Work: Most any dental work your dentist recommends is permitted.  X-rays should only be taken during the first trimester if absolutely necessary.  Your abdomen should be shielded with a lead apron during all x-rays.  Please notify your provider prior to receiving any x-rays.  Novocaine is fine; gas is not recommended.  If your dentist requires a note from us prior to dental work please call the office and we will provide one for you.  Exercise: Exercise is an important part of staying healthy during your pregnancy.  You may continue most exercises  you were accustomed to prior to pregnancy.  Later in your pregnancy you will most likely notice you have difficulty with activities requiring balance like riding a bicycle.  It is important that you listen to your body and avoid activities that put you at a higher risk of falling.  Adequate rest and staying well hydrated are a must!  If you have questions about the safety of specific activities ask your provider.    Exposure to Children with illness: Try to avoid obvious exposure; report any   symptoms to us when noted,  If you have chicken pos, red measles or mumps, you should be immune to these diseases.   Please do not take any vaccines while pregnant unless you have checked with your OB provider.  Fetal Movement: After 28 weeks we recommend you do "kick counts" twice daily.  Lie or sit down in a calm quiet environment and count your baby movements "kicks".  You should feel your baby at least 10 times per hour.  If you have not felt 10 kicks within the first hour get up, walk around and have something sweet to eat or drink then repeat for an additional hour.  If count remains less than 10 per hour notify your provider.  Fumigating: Follow your pest control agent's advice as to how long to stay out of your home.  Ventilate the area well before re-entering.  Hemorrhoids:   Most over-the-counter preparations can be used during pregnancy.  Check your medication to see what is safe to use.  It is important to use a stool softener or fiber in your diet and to drink lots of liquids.  If hemorrhoids seem to be getting worse please call the office.   Hot Tubs:  Hot tubs Jacuzzis and saunas are not recommended while pregnant.  These increase your internal body temperature and should be avoided.  Intercourse:  Sexual intercourse is safe during pregnancy as long as you are comfortable, unless otherwise advised by your provider.  Spotting may occur after intercourse; report any bright red bleeding that is heavier  than spotting.  Labor:  If you know that you are in labor, please go to the hospital.  If you are unsure, please call the office and let us help you decide what to do.  Lifting, straining, etc:  If your job requires heavy lifting or straining please check with your provider for any limitations.  Generally, you should not lift items heavier than that you can lift simply with your hands and arms (no back muscles)  Painting:  Paint fumes do not harm your pregnancy, but may make you ill and should be avoided if possible.  Latex or water based paints have less odor than oils.  Use adequate ventilation while painting.  Permanents & Hair Color:  Chemicals in hair dyes are not recommended as they cause increase hair dryness which can increase hair loss during pregnancy.  " Highlighting" and permanents are allowed.  Dye may be absorbed differently and permanents may not hold as well during pregnancy.  Sunbathing:  Use a sunscreen, as skin burns easily during pregnancy.  Drink plenty of fluids; avoid over heating.  Tanning Beds:  Because their possible side effects are still unknown, tanning beds are not recommended.  Ultrasound Scans:  Routine ultrasounds are performed at approximately 20 weeks.  You will be able to see your baby's general anatomy an if you would like to know the gender this can usually be determined as well.  If it is questionable when you conceived you may also receive an ultrasound early in your pregnancy for dating purposes.  Otherwise ultrasound exams are not routinely performed unless there is a medical necessity.  Although you can request a scan we ask that you pay for it when conducted because insurance does not cover " patient request" scans.  Work: If your pregnancy proceeds without complications you may work until your due date, unless your physician or employer advises otherwise.  Round Ligament Pain/Pelvic Discomfort:  Sharp, shooting pains not associated   with bleeding are  fairly common, usually occurring in the second trimester of pregnancy.  They tend to be worse when standing up or when you remain standing for long periods of time.  These are the result of pressure of certain pelvic ligaments called "round ligaments".  Rest, Tylenol and heat seem to be the most effective relief.  As the womb and fetus grow, they rise out of the pelvis and the discomfort improves.  Please notify the office if your pain seems different than that described.  It may represent a more serious condition.  Common Medications Safe in Pregnancy  Acne:      Constipation:  Benzoyl Peroxide     Colace  Clindamycin      Dulcolax Suppository  Topica Erythromycin     Fibercon  Salicylic Acid      Metamucil         Miralax AVOID:        Senakot   Accutane    Cough:  Retin-A       Cough Drops  Tetracycline      Phenergan w/ Codeine if Rx  Minocycline      Robitussin (Plain & DM)  Antibiotics:     Crabs/Lice:  Ceclor       RID  Cephalosporins    AVOID:  E-Mycins      Kwell  Keflex  Macrobid/Macrodantin   Diarrhea:  Penicillin      Kao-Pectate  Zithromax      Imodium AD         PUSH FLUIDS AVOID:       Cipro     Fever:  Tetracycline      Tylenol (Regular or Extra  Minocycline       Strength)  Levaquin      Extra Strength-Do not          Exceed 8 tabs/24 hrs Caffeine:        <200mg/day (equiv. To 1 cup of coffee or  approx. 3 12 oz sodas)         Gas: Cold/Hayfever:       Gas-X  Benadryl      Mylicon  Claritin       Phazyme  **Claritin-D        Chlor-Trimeton    Headaches:  Dimetapp      ASA-Free Excedrin  Drixoral-Non-Drowsy     Cold Compress  Mucinex (Guaifenasin)     Tylenol (Regular or Extra  Sudafed/Sudafed-12 Hour     Strength)  **Sudafed PE Pseudoephedrine   Tylenol Cold & Sinus     Vicks Vapor Rub  Zyrtec  **AVOID if Problems With Blood Pressure         Heartburn: Avoid lying down for at least 1 hour after meals  Aciphex      Maalox     Rash:  Milk of  Magnesia     Benadryl    Mylanta       1% Hydrocortisone Cream  Pepcid  Pepcid Complete   Sleep Aids:  Prevacid      Ambien   Prilosec       Benadryl  Rolaids       Chamomile Tea  Tums (Limit 4/day)     Unisom         Tylenol PM         Warm milk-add vanilla or  Hemorrhoids:       Sugar for taste  Anusol/Anusol H.C.  (RX: Analapram 2.5%)  Sugar Substitutes:    Hydrocortisone OTC     Ok in moderation  Preparation H      Tucks        Vaseline lotion applied to tissue with wiping    Herpes:     Throat:  Acyclovir      Oragel  Famvir  Valtrex     Vaccines:         Flu Shot Leg Cramps:       *Gardasil  Benadryl      Hepatitis A         Hepatitis B Nasal Spray:       Pneumovax  Saline Nasal Spray     Polio Booster         Tetanus Nausea:       Tuberculosis test or PPD  Vitamin B6 25 mg TID   AVOID:    Dramamine      *Gardasil  Emetrol       Live Poliovirus  Ginger Root 250 mg QID    MMR (measles, mumps &  High Complex Carbs @ Bedtime    rebella)  Sea Bands-Accupressure    Varicella (Chickenpox)  Unisom 1/2 tab TID     *No known complications           If received before Pain:         Known pregnancy;   Darvocet       Resume series after  Lortab        Delivery  Percocet    Yeast:   Tramadol      Femstat  Tylenol 3      Gyne-lotrimin  Ultram       Monistat  Vicodin           MISC:         All Sunscreens           Hair Coloring/highlights          Insect Repellant's          (Including DEET)         Mystic Tans  

## 2022-11-16 NOTE — Progress Notes (Signed)
New OB Intake  I connected with  Abigail Curry on 11/16/22 at 11:15 AM EDT by in person  and verified that I am speaking with the correct person using two identifiers. Nurse is located at [facility] and pt is located in office.  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  I explained I am completing New OB Intake today. We discussed her EDD of 05/21/2023 that is based on u/s of 12w. Pt is G5/P1122. I reviewed her allergies, medications, Medical/Surgical/OB history, and appropriate screenings. There are no cats in the home. Based on history, this is a/an pregnancy uncomplicated .   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  Anatomy US First u/s was done 11/06/22 and an anatomy scan will be done at 20 weeks.  Labs Discussed  genetic screening with patient. Patient declines genetic testing to be drawn at new OB visit. Discussed possible labs to be drawn at new OB appointment.  COVID Vaccine Patient has not had COVID vaccine.   Social Determinants of Health Food Insecurity: denies food insecurity Pt has food stamps. Transportation: Patient expressed transportation needs. Childcare: Discussed no children allowed at ultrasound appointments.   First visit review I reviewed new OB appt with pt. I explained she will have ob bloodwork and pap smear/pelvic exam if indicated. Explained pt will be seen by Guadlupe Spanish, CNM at first visit; encounter routed to appropriate provider.   Loran Senters, [facility] 11/16/2022  12:13 PM

## 2022-11-19 ENCOUNTER — Ambulatory Visit (INDEPENDENT_AMBULATORY_CARE_PROVIDER_SITE_OTHER): Payer: Medicare Other | Admitting: Obstetrics

## 2022-11-19 ENCOUNTER — Encounter: Payer: Medicare Other | Admitting: Obstetrics

## 2022-11-19 ENCOUNTER — Other Ambulatory Visit (HOSPITAL_COMMUNITY)
Admission: RE | Admit: 2022-11-19 | Discharge: 2022-11-19 | Disposition: A | Discharge status: Home / Self Care | Payer: Medicare Other | Source: Ambulatory Visit | Attending: Obstetrics | Admitting: Obstetrics

## 2022-11-19 VITALS — BP 103/64 | HR 79 | Wt 147.0 lb

## 2022-11-19 DIAGNOSIS — Z8619 Personal history of other infectious and parasitic diseases: Secondary | ICD-10-CM

## 2022-11-19 DIAGNOSIS — Z98891 History of uterine scar from previous surgery: Secondary | ICD-10-CM

## 2022-11-19 DIAGNOSIS — Z3A13 13 weeks gestation of pregnancy: Secondary | ICD-10-CM | POA: Diagnosis not present

## 2022-11-19 DIAGNOSIS — Z8759 Personal history of other complications of pregnancy, childbirth and the puerperium: Secondary | ICD-10-CM | POA: Insufficient documentation

## 2022-11-19 DIAGNOSIS — Z3481 Encounter for supervision of other normal pregnancy, first trimester: Secondary | ICD-10-CM | POA: Diagnosis not present

## 2022-11-19 MED ORDER — HYDROXYZINE HCL 10 MG PO TABS: 10.0000 mg | ORAL_TABLET | Freq: Three times a day (TID) | ORAL | 0 refills | Status: DC | PRN

## 2022-11-19 NOTE — Progress Notes (Signed)
NEW OB HISTORY AND PHYSICAL  SUBJECTIVE:       Abigail Curry is a 27 y.o. [phone] female, Patient's last menstrual period was 07/22/2022., Estimated Date of Delivery: 05/21/23, [redacted]w[redacted]d, presents today for establishment of Prenatal Care. She is feeling well. She and her partner completed the treatment for trich and chlamydia. She had a full-term vaginal birth and preterm CS for placental abruption. She would like a VBAC. She is able to read lips but is not fluent in ASL. She wakes up with anxiety daily which usually resolves over time. She would like to try medication and a referral to a counselor.  Social history Partner/Relationship: partner involved Living situation: lives with son and Asa Lente Work: [employer] Exercise: walks Substance use: denies. Declines UDS d/t insurance   Gynecologic History Patient's last menstrual period was 07/22/2022. Normal Contraception: none Last Pap: 07/19/20. Results were: normal  Obstetric History OB History  Gravida Para Term Preterm AB Living  5 2 1 1 2 2   SAB IAB Ectopic Multiple Live Births  0 1 1 0 2    # Outcome Date GA Lbr Len/2nd Weight Sex Type Anes PTL Lv  5 Current           4 IAB 2023     TAB     3 Preterm 08/21/19 [redacted]w[redacted]d  3 lb 7.4 oz (1.57 kg) M CS-LTranv Spinal  LIV     Birth Comments: preterm SGA  2 Ectopic 2020     ECTOPIC     1 Term 09/05/17    M Vag-Spont EPI N LIV    Past Medical History:  Diagnosis Date   Deaf, right birth   Hard of hearing    LEFT EAR    Past Surgical History:  Procedure Laterality Date   CESAREAN SECTION  08/21/2019   Procedure: CESAREAN SECTION;  Surgeon: Hildred Laser, MD;  Location: [facility] ORS;  Service: Obstetrics;;   COCHLEAR IMPLANT     DIAGNOSTIC LAPAROSCOPY WITH REMOVAL OF ECTOPIC PREGNANCY Left 05/10/2018   Procedure: DIAGNOSTIC LAPAROSCOPY WITH REMOVAL OF ECTOPIC PREGNANCY;  Surgeon: Christeen Douglas, MD;  Location: [facility] ORS;  Service: Gynecology;  Laterality: Left;   WISDOM TOOTH EXTRACTION      two bottom; 9th grade    Current Outpatient Medications on File Prior to Visit  Medication Sig Dispense Refill   metroNIDAZOLE (FLAGYL) 500 MG tablet Take 1 tablet (500 mg total) by mouth 2 (two) times daily. (Patient not taking: Reported on 11/16/2022) 14 tablet 1   prenatal vitamin w/FE, FA (NATACHEW) 29-1 MG CHEW chewable tablet Chew 1 tablet by mouth daily at 12 noon.     No current facility-administered medications on file prior to visit.    No Known Allergies  Social History   Socioeconomic History   Marital status: Single    Spouse name: Not on file   Number of children: 0   Years of education: 12   Highest education level: Not on file  Occupational History   Occupation: disability  Tobacco Use   Smoking status: Former    Types: E-cigarettes   Smokeless tobacco: Never  Vaping Use   Vaping status: Every Day  Substance and Sexual Activity   Alcohol use: Not Currently   Drug use: Not Currently   Sexual activity: Yes    Partners: Male    Birth control/protection: None    Comment: one week ago  Other Topics Concern   Not on file  Social History Narrative   Not on file  Social Determinants of Health   Financial Resource Strain: Low Risk  (11/16/2022)   Overall Financial Resource Strain (CARDIA)    Difficulty of Paying Living Expenses: Not hard at all  Food Insecurity: No Food Insecurity (11/16/2022)   Hunger Vital Sign    Worried About Running Out of Food in the Last Year: Never true    Ran Out of Food in the Last Year: Never true  Transportation Needs: No Transportation Needs (11/16/2022)   PRAPARE - Administrator, Civil Service (Medical): No    Lack of Transportation (Non-Medical): No  Physical Activity: Sufficiently Active (11/16/2022)   Exercise Vital Sign    Days of Exercise per Week: 7 days    Minutes of Exercise per Session: 30 min  Stress: Stress Concern Present (11/16/2022)   [employer] of Occupational Health - Occupational Stress  Questionnaire    Feeling of Stress : To some extent  Social Connections: Moderately Isolated (11/16/2022)   Social Connection and Isolation Panel [NHANES]    Frequency of Communication with Friends and Family: More than three times a week    Frequency of Social Gatherings with Friends and Family: More than three times a week    Attends Religious Services: Never    Database administrator or Organizations: No    Attends Banker Meetings: Never    Marital Status: Living with partner  Intimate Partner Violence: Not At Risk (11/16/2022)   Humiliation, Afraid, Rape, and Kick questionnaire    Fear of Current or Ex-Partner: No    Emotionally Abused: No    Physically Abused: No    Sexually Abused: No    Family History  Problem Relation Age of Onset   Diabetes Mother    Sickle cell anemia Brother    Healthy Brother    Healthy Brother    Healthy Maternal Grandmother    Diabetes Maternal Grandfather    Healthy Paternal Grandmother    Healthy Paternal Grandfather    Breast cancer Neg Hx    Ovarian cancer Neg Hx    Colon cancer Neg Hx     The following portions of the patient's history were reviewed and updated as appropriate: allergies, current medications, past OB history, past medical history, past surgical history, past family history, past social history, and problem list.  Constitutional: Denied constitutional symptoms, night sweats, recent illness, fatigue, fever, insomnia and weight loss.  Eyes: Denied eye symptoms, eye pain, photophobia, vision change and visual disturbance.  Ears/Nose/Throat/Neck: Denied ear, nose, throat or neck symptoms, hearing loss, nasal discharge, sinus congestion and sore throat.  Cardiovascular: Denied cardiovascular symptoms, arrhythmia, chest pain/pressure, edema, exercise intolerance, orthopnea and palpitations.  Respiratory: Denied pulmonary symptoms, asthma, pleuritic pain, productive sputum, cough, dyspnea and wheezing.  Gastrointestinal:  Denied, gastro-esophageal reflux, melena, nausea and vomiting.  Genitourinary: Denied genitourinary symptoms including symptomatic vaginal discharge, pelvic relaxation issues, and urinary complaints.  Musculoskeletal: Denied musculoskeletal symptoms, stiffness, swelling, muscle weakness and myalgia.  Dermatologic: Denied dermatology symptoms, rash and scar.  Neurologic: Denied neurology symptoms, dizziness, headache, neck pain and syncope.  Psychiatric: Denied psychiatric symptoms, anxiety and depression.  Endocrine: Denied endocrine symptoms including hot flashes and night sweats.     OBJECTIVE: Initial Physical Exam (New OB)  GENERAL APPEARANCE: alert, well appearing HEAD: normocephalic, atraumatic MOUTH: mucous membranes moist, pharynx normal without lesions THYROID: no thyromegaly or masses present BREASTS: no masses noted, no significant tenderness, no palpable axillary nodes, no skin changes LUNGS: clear to auscultation, no wheezes, rales  or rhonchi, symmetric air entry HEART: regular rate and rhythm, no murmurs ABDOMEN: soft, nontender, nondistended, no abnormal masses, no epigastric pain, FHT present EXTREMITIES: no redness or tenderness in the calves or thighs SKIN: normal coloration and turgor, no rashes LYMPH NODES: no adenopathy palpable NEUROLOGIC: alert, oriented, normal speech, no focal findings or movement disorder noted  PELVIC EXAM EXTERNAL GENITALIA: normal appearing vulva with no masses, tenderness or lesions UTERUS: gravid and consistent with 13 weeks  ASSESSMENT: Normal pregnancy [redacted]w[redacted]d   PLAN: Routine prenatal care. We discussed an overview of prenatal care and when to call. Reviewed diet, exercise, and weight gain recommendations in pregnancy. Discussed benefits of breastfeeding and lactation resources at [facility]. I reviewed labs and answered all questions. TOC done today. Encouraged condom use and vape cessation. Needs TOLAC counseling with MD in 3rd  trimester. Rx sent for hydroxyzine with instructions for use. Message sent to Gayland Curry to help arrange counseling. RTC in 4 weeks for ROB.  See orders  Guadlupe Spanish, CNM

## 2022-11-21 LAB — CERVICOVAGINAL ANCILLARY ONLY
Bacterial Vaginitis (gardnerella): NEGATIVE
Candida Glabrata: NEGATIVE
Candida Vaginitis: NEGATIVE
Chlamydia: NEGATIVE
Comment: NEGATIVE
Comment: NEGATIVE
Comment: NEGATIVE
Comment: NEGATIVE
Comment: NEGATIVE
Comment: NORMAL
Neisseria Gonorrhea: NEGATIVE
Trichomonas: NEGATIVE

## 2022-11-22 ENCOUNTER — Ambulatory Visit: Payer: Medicare Other

## 2022-11-22 ENCOUNTER — Encounter: Payer: Self-pay | Admitting: Obstetrics

## 2022-12-17 ENCOUNTER — Encounter: Payer: Medicare Other | Admitting: Obstetrics

## 2022-12-17 NOTE — Progress Notes (Deleted)
    Return Prenatal Note   Subjective  27 y.o. [id number] at [redacted]w[redacted]d presents for this follow-up prenatal visit.  Patient ***  Patient reports:   Denies vaginal bleeding or leaking fluid. Objective  Flow sheet Vitals:   Total weight gain: 11 lb (4.99 kg)  General Appearance  No acute distress, well appearing, and well nourished Pulmonary   Normal work of breathing Neurologic   Alert and oriented to person, place, and time Psychiatric   Mood and affect within normal limits  Assessment/Plan   Plan  27 y.o. [id number] at [redacted]w[redacted]d presents for follow-up OB visit. Reviewed prenatal record including previous visit note. There are no diagnoses linked to this encounter.  No problem-specific Assessment & Plan notes found for this encounter.    No orders of the defined types were placed in this encounter.  No follow-ups on file.   Future Appointments  Date Time Provider Department Center  12/17/2022  9:55 AM Julieanne Manson, MD [facility] None    For next visit:  {SJFprenatalcare:29716}      Julieanne Manson, DO [facility]

## 2022-12-19 ENCOUNTER — Ambulatory Visit (INDEPENDENT_AMBULATORY_CARE_PROVIDER_SITE_OTHER): Payer: Medicare Other | Admitting: Obstetrics

## 2022-12-19 VITALS — BP 100/60 | HR 69 | Wt 160.0 lb

## 2022-12-19 DIAGNOSIS — Z3480 Encounter for supervision of other normal pregnancy, unspecified trimester: Secondary | ICD-10-CM

## 2022-12-19 DIAGNOSIS — Z98891 History of uterine scar from previous surgery: Secondary | ICD-10-CM

## 2022-12-19 DIAGNOSIS — Z8619 Personal history of other infectious and parasitic diseases: Secondary | ICD-10-CM

## 2022-12-19 NOTE — Progress Notes (Signed)
    Return Prenatal Note   Subjective  This encounter was facilitated by video ASL language interpreter.  27 y.o. [id number] at [redacted]w[redacted]d presents for this follow-up prenatal visit. Pregnancy notable for prior CD x 1 at 33wks for abruption, chlamydia and trichomonas infections in first trimester, and maternal hearing loss.   Patient is doing well, no concerns today. Declines Flu vaccine.   Patient reports: Movement: Present Contractions: Not present Denies vaginal bleeding or leaking fluid. Objective  Flow sheet Vitals: Pulse Rate: 69 BP: 100/60 Fetal Heart Rate (bpm): 152 Total weight gain: 24 lb (10.9 kg)  General Appearance  No acute distress, well appearing, and well nourished Pulmonary   Normal work of breathing Neurologic   Alert and oriented to person, place, and time Psychiatric   Mood and affect within normal limits  Assessment/Plan   Plan  27 y.o. [id number] at [redacted]w[redacted]d by 12wk [place] presents for follow-up OB visit. Reviewed prenatal record including previous visit note. 1. Supervision of other normal pregnancy, antepartum 2. History of cesarean delivery 3. History of chlamydia 4. History of trichomoniasis  -Anatomy US ordered for 20wks -Angie from HD here today, helping pt connect with care team/counseling  Follow up in 2wks for anatomy US, 4wks for ROB  For next visit:  continue with routine prenatal care    Julieanne Manson, DO [facility]

## 2023-01-04 ENCOUNTER — Ambulatory Visit: Payer: Medicare Other

## 2023-01-04 DIAGNOSIS — Z3A20 20 weeks gestation of pregnancy: Secondary | ICD-10-CM

## 2023-01-04 DIAGNOSIS — Z3482 Encounter for supervision of other normal pregnancy, second trimester: Secondary | ICD-10-CM | POA: Diagnosis not present

## 2023-01-04 DIAGNOSIS — Z3480 Encounter for supervision of other normal pregnancy, unspecified trimester: Secondary | ICD-10-CM

## 2023-01-16 ENCOUNTER — Encounter: Payer: Self-pay | Admitting: Certified Nurse Midwife

## 2023-01-16 ENCOUNTER — Ambulatory Visit: Payer: Medicare Other | Admitting: Certified Nurse Midwife

## 2023-01-16 VITALS — BP 108/70 | HR 88 | Wt 155.1 lb

## 2023-01-16 DIAGNOSIS — Z3A22 22 weeks gestation of pregnancy: Secondary | ICD-10-CM

## 2023-01-16 DIAGNOSIS — O212 Late vomiting of pregnancy: Secondary | ICD-10-CM

## 2023-01-16 NOTE — Patient Instructions (Signed)
Oral Glucose Tolerance Test During Pregnancy Why am I having this test? The oral glucose tolerance test (OGTT) is done to check how your body processes blood sugar (glucose). This is one of several tests used to diagnose diabetes that develops during pregnancy (gestational diabetes mellitus). Gestational diabetes is a short-term form of diabetes that some women develop while they are pregnant. It usually occurs during the second trimester of pregnancy and goes away after delivery. Testing, or screening, for gestational diabetes usually occurs at weeks 24-28 of pregnancy. You may have the OGTT test after having a 1-hour glucose screening test if the results from that test indicate that you may have gestational diabetes. This test may also be needed if: You have a history of gestational diabetes. There is a history of giving birth to very large babies or of losing pregnancies (having stillbirths). You have signs and symptoms of diabetes, such as: Changes in your eyesight. Tingling or numbness in your hands or feet. Changes in hunger, thirst, and urination, and these are not explained by your pregnancy. What is being tested? This test measures the amount of glucose in your blood at different times during a period of 3 hours. This shows how well your body can process glucose. What kind of sample is taken?  Blood samples are required for this test. They are usually collected by inserting a needle into a blood vessel. How do I prepare for this test? For 3 days before your test, eat normally. Have plenty of carbohydrate-rich foods. Follow instructions from your health care provider about: Eating or drinking restrictions on the day of the test. You may be asked not to eat or drink anything other than water (to fast) starting 8-10 hours before the test. Changing or stopping your regular medicines. Some medicines may interfere with this test. Tell a health care provider about: All medicines you are  taking, including vitamins, herbs, eye drops, creams, and over-the-counter medicines. Any blood disorders you have. Any surgeries you have had. Any medical conditions you have. What happens during the test? First, your blood glucose will be measured. This is referred to as your fasting blood glucose because you fasted before the test. Then, you will drink a glucose solution that contains a certain amount of glucose. Your blood glucose will be measured again 1, 2, and 3 hours after you drink the solution. This test takes about 3 hours to complete. You will need to stay at the testing location during this time. During the testing period: Do not eat or drink anything other than the glucose solution. Do not exercise. Do not use any products that contain nicotine or tobacco, such as cigarettes, e-cigarettes, and chewing tobacco. These can affect your test results. If you need help quitting, ask your health care provider. The testing procedure may vary among health care providers and hospitals. How are the results reported? Your results will be reported as milligrams of glucose per deciliter of blood (mg/dL) or millimoles per liter (mmol/L). There is more than one source for screening and diagnosis reference values used to diagnose gestational diabetes. Your health care provider will compare your results to normal values that were established after testing a large group of people (reference values). Reference values may vary among labs and hospitals. For this test (Carpenter-Coustan), reference values are: Fasting: 95 mg/dL (5.3 mmol/L). 1 hour: 180 mg/dL (09.8 mmol/L). 2 hour: 155 mg/dL (8.6 mmol/L). 3 hour: 140 mg/dL (7.8 mmol/L). What do the results mean? Results below the reference values are  considered normal. If two or more of your blood glucose levels are at or above the reference values, you may be diagnosed with gestational diabetes. If only one level is high, your health care provider may  suggest repeat testing or other tests to confirm a diagnosis. Talk with your health care provider about what your results mean. Questions to ask your health care provider Ask your health care provider, or the department that is doing the test: When will my results be ready? How will I get my results? What are my treatment options? What other tests do I need? What are my next steps? Summary The oral glucose tolerance test (OGTT) is one of several tests used to diagnose diabetes that develops during pregnancy (gestational diabetes mellitus). Gestational diabetes is a short-term form of diabetes that some women develop while they are pregnant. You may have the OGTT test after having a 1-hour glucose screening test if the results from that test show that you may have gestational diabetes. You may also have this test if you have any symptoms or risk factors for this type of diabetes. Talk with your health care provider about what your results mean. This information is not intended to replace advice given to you by your health care provider. Make sure you discuss any questions you have with your health care provider. Document Revised: 09/12/2021 Document Reviewed: 07/16/2019 Elsevier Patient Education  2024 [employer].

## 2023-01-16 NOTE — Progress Notes (Signed)
ROB doing well, feeling good movement. Pt c/o belly button pain . Discussed possibility of hernia, no significant out pouching noticed today. Pt states she has noticed it some. Reviewed typically managed postpartum should it remain present after delivery. She verbalizes understanding. Discussed glucose testing next visit. Handout given with instructions for eating .  Social Worker  seen patient this visit. Discussed appointment with Kathreen Cosier.  Follow up 5 weeks.   Doreene Burke, CNM

## 2023-01-21 ENCOUNTER — Encounter: Payer: Self-pay | Admitting: Licensed Clinical Social Worker

## 2023-01-21 ENCOUNTER — Telehealth: Payer: Self-pay | Admitting: Licensed Clinical Social Worker

## 2023-01-21 NOTE — Telephone Encounter (Signed)
-----   Message from Nurse Merian Capron sent at 01/16/2023 11:36 AM EST ----- Regarding: BH referral Good Morning Marchelle Folks I hope you are doing well this morning.    Please accept this as a referral for this client. She is currently [redacted] weeks pregnant and she is experiencing some anxiety. She requests counseling.    Please note that she is hearing impaired and reads lips and uses ASL interpreter as well.     Have a happy Thanksgiving  Cassandra

## 2023-02-19 ENCOUNTER — Other Ambulatory Visit: Payer: Self-pay | Admitting: Obstetrics

## 2023-02-19 DIAGNOSIS — O283 Abnormal ultrasonic finding on antenatal screening of mother: Secondary | ICD-10-CM | POA: Insufficient documentation

## 2023-02-19 DIAGNOSIS — Z3480 Encounter for supervision of other normal pregnancy, unspecified trimester: Secondary | ICD-10-CM

## 2023-02-19 DIAGNOSIS — Z362 Encounter for other antenatal screening follow-up: Secondary | ICD-10-CM

## 2023-02-20 NOTE — L&D Delivery Note (Signed)
      Delivery Note   Lylah FRANCOISE CHOJNOWSKI is a 28 y.o. [id number] at [redacted]w[redacted]d Estimated Date of Delivery: 05/21/23  PRE-OPERATIVE DIAGNOSIS:  1) [redacted]w[redacted]d pregnancy.    POST-OPERATIVE DIAGNOSIS:  1) [redacted]w[redacted]d pregnancy s/p Vaginal, Spontaneous  Delivery Type: Vaginal, Spontaneous   Delivery Anesthesia: Epidural  Labor Complications:  none    ESTIMATED BLOOD LOSS: 100 ml    FINDINGS:   1) female infant, Apgar scores of 9  at 1 minute and  9  at 5 minutes and a birthweight pending , infant remains skin to skin.     2) Nuchal cord: None  SPECIMENS:   PLACENTA:   Appearance: Intact, 3 vessel cord, cord blood sample collected.    Removal: Spontaneous     Disposition:  per protocol   DISPOSITION:  Infant to left in stable condition in the delivery room, with L&D personnel and mother,  NARRATIVE SUMMARY: Labor course:  Ms. HANAKO TIPPING is a [id number] at 102w6d who presented for labor management.  She progressed well in labor with pitocin.  She received the appropriate epidural anesthesia and proceeded to complete dilation. She evidenced good maternal expulsive effort during the second stage. She went on to deliver a viable infant. The placenta delivered without problems and was noted to be complete. A perineal and vaginal examination was performed. Episiotomy/Lacerations: None. The patient tolerated this well.  Doreene Burke, CNM 05/20/2023 11:53 PM

## 2023-02-21 ENCOUNTER — Ambulatory Visit (INDEPENDENT_AMBULATORY_CARE_PROVIDER_SITE_OTHER): Payer: Medicare Other | Admitting: Licensed Practical Nurse

## 2023-02-21 ENCOUNTER — Other Ambulatory Visit: Payer: Self-pay

## 2023-02-21 ENCOUNTER — Other Ambulatory Visit (HOSPITAL_COMMUNITY)
Admission: RE | Admit: 2023-02-21 | Discharge: 2023-02-21 | Disposition: A | Discharge status: Home / Self Care | Payer: Medicare Other | Source: Ambulatory Visit | Attending: Licensed Practical Nurse | Admitting: Licensed Practical Nurse

## 2023-02-21 ENCOUNTER — Encounter: Payer: Self-pay | Admitting: Licensed Practical Nurse

## 2023-02-21 ENCOUNTER — Other Ambulatory Visit: Payer: Medicare Other

## 2023-02-21 ENCOUNTER — Encounter: Payer: Medicare Other | Admitting: Obstetrics and Gynecology

## 2023-02-21 VITALS — BP 101/64 | HR 93 | Wt 175.4 lb

## 2023-02-21 DIAGNOSIS — Z3480 Encounter for supervision of other normal pregnancy, unspecified trimester: Secondary | ICD-10-CM | POA: Diagnosis present

## 2023-02-21 DIAGNOSIS — Z113 Encounter for screening for infections with a predominantly sexual mode of transmission: Secondary | ICD-10-CM

## 2023-02-21 DIAGNOSIS — Z131 Encounter for screening for diabetes mellitus: Secondary | ICD-10-CM

## 2023-02-21 DIAGNOSIS — Z3A27 27 weeks gestation of pregnancy: Secondary | ICD-10-CM

## 2023-02-21 DIAGNOSIS — Z130 Encounter for screening for diseases of the blood and blood-forming organs and certain disorders involving the immune mechanism: Secondary | ICD-10-CM

## 2023-02-21 DIAGNOSIS — Z3A28 28 weeks gestation of pregnancy: Secondary | ICD-10-CM

## 2023-02-21 LAB — CERVICOVAGINAL ANCILLARY ONLY
Bacterial Vaginitis (gardnerella): NEGATIVE
Candida Glabrata: NEGATIVE
Candida Vaginitis: POSITIVE — AB
Chlamydia: NEGATIVE
Comment: NEGATIVE
Comment: NEGATIVE
Comment: NEGATIVE
Comment: NEGATIVE
Comment: NEGATIVE
Comment: NORMAL
Neisseria Gonorrhea: NEGATIVE
Trichomonas: NEGATIVE

## 2023-02-21 NOTE — Progress Notes (Signed)
 Routine Prenatal Care Visit  Subjective  Abigail Curry is a 28 y.o. [id number] at [redacted]w[redacted]d being seen today for ongoing prenatal care.  She is currently monitored for the following issues for this high-risk pregnancy and has Bilateral sensorineural hearing loss; History of chlamydia; History of trichomoniasis; History of cesarean delivery; Supervision of other normal pregnancy, antepartum; History of placental abruption; and Echogenic intracardiac focus of fetus on prenatal ultrasound on their problem list.  ----------------------------------------------------------------------------------- Patient reports  allergies .   -Doing ok. Mood has been good. Recently moved to a new apt.  -plans to bottle feed -Her Grandmother will watch her children, ages 9 and 2,while in labor, her best friend will be her labor support.  -Needs fu anatomy US , was ordered but not yet scheduled.  -TWG 39lbs, [place] walks daily, eats a variety of vegetables and carbs  Contractions: Not present. Vag. Bleeding: None.  Movement: Present. Leaking Fluid denies.  ----------------------------------------------------------------------------------- The following portions of the patient's history were reviewed and updated as appropriate: allergies, current medications, past family history, past medical history, past social history, past surgical history and problem list. Problem list updated.  Objective  Blood pressure 101/64, pulse 93, weight 175 lb 6.4 oz (79.6 kg), last menstrual period 07/22/2022, unknown if currently breastfeeding. Pregravid weight 136 lb (61.7 kg) Total Weight Gain 39 lb 6.4 oz (17.9 kg) Urinalysis: Urine Protein    Urine Glucose    Fetal Status: Fetal Heart Rate (bpm): 155 Fundal Height: 28 cm Movement: Present     General:  Alert, oriented and cooperative. Patient is in no acute distress.  Skin: Skin is warm and dry. No rash noted.   Cardiovascular: Normal heart rate noted  Respiratory: Normal  respiratory effort, no problems with respiration noted  Abdomen: Soft, gravid, appropriate for gestational age. Pain/Pressure: Absent     Pelvic:  Cervical exam deferred        Extremities: Normal range of motion.  Edema: None  Mental Status: Normal mood and affect. Normal behavior. Normal judgment and thought content.   Assessment   28 y.o. [id number] at [redacted]w[redacted]d by  05/21/2023, by Ultrasound presenting for routine prenatal visit  Plan   fifth Problems (from 11/16/22 to present)     Problem Noted Diagnosed Resolved   Supervision of other normal pregnancy, antepartum 11/16/2022 by Vicci Levan, CMA  No   Overview Addendum 02/21/2023  9:11 AM by Donelda Burnard CROME, CMA   Clinical Staff Provider  Office Location  [facility] Dating  12wk US   Language  ASL Anatomy US     Flu Vaccine  Declined  Genetic Screen  NIPS:   TDaP vaccine   Declined Hgb A1C or  GTT Early : Third trimester :   Covid declined   LAB RESULTS   Rhogam  O/Positive/-- (09/10 9057)  Blood Type O/Positive/-- (09/10 9057)   RSV offer Antibody Negative (09/10 0942)  Feeding Plan Will try breast Rubella 3.48 (09/10 0942)  Contraception none RPR Non Reactive (09/10 0942)   Circumcision yes HBsAg Negative (09/10 0942)   Pediatrician  [facility] HIV Non Reactive (09/10 0942)  Support Person [place] Varicella 249 (09/10 9057)  Prenatal Classes yes GBS  (For PCN allergy, check sensitivities)     Hep C Non Reactive (09/10 0942)   BTL Consent  Pap No results found for: DIAGPAP  VBAC Consent  Hgb Electro      CF      SMA  Preterm labor symptoms and general obstetric precautions including but not limited to vaginal bleeding, contractions, leaking of fluid and fetal movement were reviewed in detail with the patient. Please refer to After Visit Summary for other counseling recommendations.   Return in about 2 weeks (around 03/07/2023) for ROB.  28wk labs collected  Declines TDAP TOC aptima  collected Reminded to scheduled US    Jinnie Cookey, CNM  [facility]  02/21/23  9:26 AM

## 2023-02-21 NOTE — Progress Notes (Signed)
101/967 

## 2023-02-22 ENCOUNTER — Encounter: Payer: Self-pay | Admitting: Licensed Practical Nurse

## 2023-02-22 ENCOUNTER — Other Ambulatory Visit: Payer: Self-pay | Admitting: Licensed Practical Nurse

## 2023-02-22 DIAGNOSIS — B3731 Acute candidiasis of vulva and vagina: Secondary | ICD-10-CM

## 2023-02-22 DIAGNOSIS — D509 Iron deficiency anemia, unspecified: Secondary | ICD-10-CM | POA: Insufficient documentation

## 2023-02-22 LAB — 28 WEEK RH+PANEL
Basophils Absolute: 0 10*3/uL (ref 0.0–0.2)
Basos: 0 %
EOS (ABSOLUTE): 0.1 10*3/uL (ref 0.0–0.4)
Eos: 1 %
Gestational Diabetes Screen: 125 mg/dL (ref 70–139)
HIV Screen 4th Generation wRfx: NONREACTIVE
Hematocrit: 31.4 % — ABNORMAL LOW (ref 34.0–46.6)
Hemoglobin: 10.1 g/dL — ABNORMAL LOW (ref 11.1–15.9)
Immature Grans (Abs): 0.1 10*3/uL (ref 0.0–0.1)
Immature Granulocytes: 1 %
Lymphocytes Absolute: 1.1 10*3/uL (ref 0.7–3.1)
Lymphs: 11 %
MCH: 30.5 pg (ref 26.6–33.0)
MCHC: 32.2 g/dL (ref 31.5–35.7)
MCV: 95 fL (ref 79–97)
Monocytes Absolute: 0.5 10*3/uL (ref 0.1–0.9)
Monocytes: 4 %
Neutrophils Absolute: 8.6 10*3/uL — ABNORMAL HIGH (ref 1.4–7.0)
Neutrophils: 83 %
Platelets: 277 10*3/uL (ref 150–450)
RBC: 3.31 x10E6/uL — ABNORMAL LOW (ref 3.77–5.28)
RDW: 12.4 % (ref 11.7–15.4)
RPR Ser Ql: NONREACTIVE
WBC: 10.4 10*3/uL (ref 3.4–10.8)

## 2023-02-22 MED ORDER — ACCRUFER 30 MG PO CAPS: 1.0000 | ORAL_CAPSULE | Freq: Two times a day (BID) | ORAL | 3 refills | Status: DC

## 2023-02-22 MED ORDER — TERCONAZOLE 0.4 % VA CREA: 1.0000 | TOPICAL_CREAM | Freq: Every day | VAGINAL | 0 refills | Status: DC

## 2023-02-22 NOTE — Progress Notes (Signed)
 Yeast seen on TOC Hemoglobin 10.1 Script for [employer] and [employer] sent Mychart message sent Jannifer Hick  [facility]  02/22/23  1:27 PM

## 2023-02-25 ENCOUNTER — Ambulatory Visit
Admission: RE | Admit: 2023-02-25 | Discharge: 2023-02-25 | Disposition: A | Discharge status: Home / Self Care | Payer: Medicare Other | Source: Ambulatory Visit | Attending: Obstetrics | Admitting: Obstetrics

## 2023-02-25 DIAGNOSIS — Z3A28 28 weeks gestation of pregnancy: Secondary | ICD-10-CM | POA: Diagnosis not present

## 2023-02-25 DIAGNOSIS — O283 Abnormal ultrasonic finding on antenatal screening of mother: Secondary | ICD-10-CM

## 2023-02-25 DIAGNOSIS — Z362 Encounter for other antenatal screening follow-up: Secondary | ICD-10-CM | POA: Diagnosis present

## 2023-02-25 DIAGNOSIS — Z3480 Encounter for supervision of other normal pregnancy, unspecified trimester: Secondary | ICD-10-CM | POA: Diagnosis present

## 2023-03-07 ENCOUNTER — Encounter: Payer: Self-pay | Admitting: Licensed Practical Nurse

## 2023-03-07 ENCOUNTER — Ambulatory Visit: Payer: Medicare Other | Admitting: Licensed Practical Nurse

## 2023-03-07 VITALS — BP 111/71 | HR 93 | Wt 173.1 lb

## 2023-03-07 DIAGNOSIS — Z3403 Encounter for supervision of normal first pregnancy, third trimester: Secondary | ICD-10-CM

## 2023-03-07 DIAGNOSIS — O99019 Anemia complicating pregnancy, unspecified trimester: Secondary | ICD-10-CM

## 2023-03-07 DIAGNOSIS — Z3480 Encounter for supervision of other normal pregnancy, unspecified trimester: Secondary | ICD-10-CM

## 2023-03-07 DIAGNOSIS — Z3A29 29 weeks gestation of pregnancy: Secondary | ICD-10-CM

## 2023-03-07 DIAGNOSIS — D509 Iron deficiency anemia, unspecified: Secondary | ICD-10-CM

## 2023-03-07 LAB — POCT URINALYSIS DIPSTICK OB
Appearance: NORMAL
Bilirubin, UA: NEGATIVE
Blood, UA: NEGATIVE
Glucose, UA: NEGATIVE
Ketones, UA: NEGATIVE
Leukocytes, UA: NEGATIVE
Nitrite, UA: NEGATIVE
Odor: NORMAL
POC,PROTEIN,UA: NEGATIVE
Spec Grav, UA: 1.015 (ref 1.010–1.025)
Urobilinogen, UA: 0.2 U/dL
pH, UA: 6 (ref 5.0–8.0)

## 2023-03-07 NOTE — Assessment & Plan Note (Addendum)
Third trimester precautions discussed. No concerns at this time. Questions encouraged and answered.

## 2023-03-07 NOTE — Progress Notes (Addendum)
    Return Prenatal Note   Subjective  ASL Interpreter present via video for entirety of visit.  28 y.o. [id number] at [redacted]w[redacted]d presents for this follow-up prenatal visit.  Doing well overall. No concerns this visit. She reports picking up her terconazole cream from last visit, but has not picked up iron prescription yet. Hoping to be able to pick it up today. Baby is moving well.  Patient reports: Movement: Present Contractions: Not present  Objective   Flow sheet Vitals: Pulse Rate: 93 BP: 111/71 Fundal Height: 30 cm Fetal Heart Rate (bpm): 140 Total weight gain: 16.8 kg  General Appearance  No acute distress, well appearing, and well nourished Pulmonary   Normal work of breathing Neurologic   Alert and oriented to person, place, and time Psychiatric   Mood and affect within normal limits  Assessment/Plan   Plan  28 y.o. [id number] at [redacted]w[redacted]d presents for follow-up OB visit. Reviewed prenatal record including previous visit note.   Iron deficiency anemia during pregnancy, antepartum Discussed importance of picking up iron prescription. She hopes to pick up today. Discussed increasing fiber while taking iron to mitigate constipation.   Supervision of other normal pregnancy, antepartum Third trimester precautions discussed. No concerns at this time. Questions encouraged and answered.       Orders Placed This Encounter  Procedures   POC Urinalysis Dipstick OB   Return in about 2 weeks (around 03/21/2023) for ROB.   Future Appointments  Date Time Provider Department Center  03/21/2023 10:15 AM Glenetta Borg, CNM [facility] None    For next visit:  continue with routine prenatal care     Eloise Levels, Student-MidWife  03/06/2510:45 PM

## 2023-03-07 NOTE — Assessment & Plan Note (Signed)
Discussed importance of picking up iron prescription. She hopes to pick up today. Discussed increasing fiber while taking iron to mitigate constipation.

## 2023-03-21 ENCOUNTER — Ambulatory Visit (INDEPENDENT_AMBULATORY_CARE_PROVIDER_SITE_OTHER): Payer: Medicare Other | Admitting: Obstetrics

## 2023-03-21 ENCOUNTER — Other Ambulatory Visit (HOSPITAL_COMMUNITY)
Admission: RE | Admit: 2023-03-21 | Discharge: 2023-03-21 | Disposition: A | Discharge status: Home / Self Care | Payer: Medicare Other | Source: Ambulatory Visit | Attending: Obstetrics | Admitting: Obstetrics

## 2023-03-21 VITALS — BP 115/67 | HR 98 | Wt 176.0 lb

## 2023-03-21 DIAGNOSIS — Z98891 History of uterine scar from previous surgery: Secondary | ICD-10-CM

## 2023-03-21 DIAGNOSIS — Z3480 Encounter for supervision of other normal pregnancy, unspecified trimester: Secondary | ICD-10-CM

## 2023-03-21 DIAGNOSIS — O99019 Anemia complicating pregnancy, unspecified trimester: Secondary | ICD-10-CM | POA: Insufficient documentation

## 2023-03-21 DIAGNOSIS — Z8759 Personal history of other complications of pregnancy, childbirth and the puerperium: Secondary | ICD-10-CM | POA: Diagnosis present

## 2023-03-21 DIAGNOSIS — Z3A31 31 weeks gestation of pregnancy: Secondary | ICD-10-CM | POA: Diagnosis present

## 2023-03-21 DIAGNOSIS — D509 Iron deficiency anemia, unspecified: Secondary | ICD-10-CM | POA: Diagnosis present

## 2023-03-21 DIAGNOSIS — Z3483 Encounter for supervision of other normal pregnancy, third trimester: Secondary | ICD-10-CM

## 2023-03-21 DIAGNOSIS — Z202 Contact with and (suspected) exposure to infections with a predominantly sexual mode of transmission: Secondary | ICD-10-CM

## 2023-03-21 NOTE — Assessment & Plan Note (Signed)
-   Plans TOLAC. Will schedule next visit with MD for [facility] consent.

## 2023-03-21 NOTE — Progress Notes (Signed)
    Return Prenatal Note   Assessment/Plan   Plan  28 y.o. [id number] at [redacted]w[redacted]d presents for follow-up OB visit. Reviewed prenatal record including previous visit note.  Supervision of other normal pregnancy, antepartum - STI screening today. Discussed HSV suppression if indicated. - Desires VBAC. Will schedule MD visit for counseling. - Encouraged condom use with partner. - Reviewed s/s of PTL and when to go to the hospital.   Orders Placed This Encounter  Procedures   HSV 1 and 2 Ab, IgG   HEP, RPR, HIV Panel   Return in about 2 weeks (around 04/04/2023).   Future Appointments  Date Time Provider Department Center  04/04/2023 10:35 AM Linzie Collin, MD [facility] None    For next visit:  Routine prenatal care, VBAC counseling    Subjective   Opie is feeling concerned about possible STI exposure from her partner. She is having a yellowish vaginal discharge. She reports that her partner may have HSV and she would like blood work done. She denies pain, burning, and lesions.   Movement: Present Contractions: Not present  Objective   Flow sheet Vitals: Pulse Rate: 98 BP: 115/67 Total weight gain: 40 lb (18.1 kg)  General Appearance  No acute distress, well appearing, and well nourished Pulmonary   Normal work of breathing Neurologic   Alert and oriented to person, place, and time Psychiatric   Mood and affect within normal limits  Guadlupe Spanish, CNM 03/21/23 11:03 AM

## 2023-03-21 NOTE — Assessment & Plan Note (Addendum)
-   STI screening today. Discussed HSV suppression if indicated. - Encouraged condom use with partner. - Reviewed s/s of PTL and when to go to the hospital.

## 2023-03-21 NOTE — Assessment & Plan Note (Signed)
Taking iron daily

## 2023-03-22 LAB — CERVICOVAGINAL ANCILLARY ONLY
Bacterial Vaginitis (gardnerella): NEGATIVE
Candida Glabrata: NEGATIVE
Candida Vaginitis: NEGATIVE
Chlamydia: NEGATIVE
Comment: NEGATIVE
Comment: NEGATIVE
Comment: NEGATIVE
Comment: NEGATIVE
Comment: NEGATIVE
Comment: NORMAL
Neisseria Gonorrhea: NEGATIVE
Trichomonas: NEGATIVE

## 2023-03-22 LAB — HEP, RPR, HIV PANEL
HIV Screen 4th Generation wRfx: NONREACTIVE
Hepatitis B Surface Ag: NEGATIVE
RPR Ser Ql: NONREACTIVE

## 2023-03-22 LAB — HSV 1 AND 2 AB, IGG
HSV 1 Glycoprotein G Ab, IgG: NONREACTIVE
HSV 2 IgG, Type Spec: REACTIVE — AB

## 2023-03-29 ENCOUNTER — Encounter: Payer: Self-pay | Admitting: Obstetrics

## 2023-04-04 ENCOUNTER — Encounter: Payer: Medicare Other | Admitting: Obstetrics and Gynecology

## 2023-04-04 ENCOUNTER — Ambulatory Visit (INDEPENDENT_AMBULATORY_CARE_PROVIDER_SITE_OTHER): Payer: Medicare Other | Admitting: Obstetrics and Gynecology

## 2023-04-04 ENCOUNTER — Encounter: Payer: Self-pay | Admitting: Obstetrics and Gynecology

## 2023-04-04 VITALS — BP 112/74 | HR 83 | Wt 173.4 lb

## 2023-04-04 DIAGNOSIS — Z3480 Encounter for supervision of other normal pregnancy, unspecified trimester: Secondary | ICD-10-CM | POA: Diagnosis not present

## 2023-04-04 DIAGNOSIS — Z98891 History of uterine scar from previous surgery: Secondary | ICD-10-CM

## 2023-04-04 DIAGNOSIS — Z8759 Personal history of other complications of pregnancy, childbirth and the puerperium: Secondary | ICD-10-CM | POA: Diagnosis not present

## 2023-04-04 DIAGNOSIS — B009 Herpesviral infection, unspecified: Secondary | ICD-10-CM

## 2023-04-04 DIAGNOSIS — Z3A33 33 weeks gestation of pregnancy: Secondary | ICD-10-CM | POA: Diagnosis not present

## 2023-04-04 MED ORDER — VALACYCLOVIR HCL 500 MG PO TABS: 500.0000 mg | ORAL_TABLET | Freq: Two times a day (BID) | ORAL | 6 refills | Status: DC

## 2023-04-04 NOTE — Progress Notes (Signed)
ROB. Patient states baby is very active. Would like to discuss VBAC today, history of cesarean in 2021 due to placental abruption. Recent HSV2 diagnosis, Valtrex rx sent in.  Patient states no additional questions or concerns at this time.

## 2023-04-04 NOTE — Progress Notes (Signed)
ROB: 34 weeks.  She would like to have a VBAC.  She has previously had a vaginal birth and a cesarean birth.  The cesarean birth was for placental abruption.  We have discussed the risks and benefits of cesarean versus vaginal birth.  We have reviewed the form for TOLAC and she has signed the paperwork.  I believe she is a good candidate for vaginal birth after cesarean. 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  Prescription written for suppression at 36 weeks.  I have discussed this with her and she will begin the medication at that time. She reports daily fetal movement.  Denies contractions. GC/CT-GBS next visit.

## 2023-04-18 ENCOUNTER — Encounter: Payer: Medicare Other | Admitting: Obstetrics and Gynecology

## 2023-04-18 DIAGNOSIS — Z3480 Encounter for supervision of other normal pregnancy, unspecified trimester: Secondary | ICD-10-CM

## 2023-04-18 DIAGNOSIS — Z3A35 35 weeks gestation of pregnancy: Secondary | ICD-10-CM

## 2023-04-22 ENCOUNTER — Ambulatory Visit (INDEPENDENT_AMBULATORY_CARE_PROVIDER_SITE_OTHER): Payer: Medicare Other | Admitting: Certified Nurse Midwife

## 2023-04-22 ENCOUNTER — Other Ambulatory Visit (HOSPITAL_COMMUNITY)
Admission: RE | Admit: 2023-04-22 | Discharge: 2023-04-22 | Disposition: A | Discharge status: Home / Self Care | Source: Ambulatory Visit | Attending: Certified Nurse Midwife | Admitting: Certified Nurse Midwife

## 2023-04-22 ENCOUNTER — Encounter: Payer: Self-pay | Admitting: Certified Nurse Midwife

## 2023-04-22 VITALS — BP 102/67 | HR 96 | Wt 189.5 lb

## 2023-04-22 DIAGNOSIS — Z3685 Encounter for antenatal screening for Streptococcus B: Secondary | ICD-10-CM

## 2023-04-22 DIAGNOSIS — Z3A35 35 weeks gestation of pregnancy: Secondary | ICD-10-CM

## 2023-04-22 DIAGNOSIS — Z3483 Encounter for supervision of other normal pregnancy, third trimester: Secondary | ICD-10-CM

## 2023-04-22 DIAGNOSIS — Z113 Encounter for screening for infections with a predominantly sexual mode of transmission: Secondary | ICD-10-CM

## 2023-04-22 NOTE — Patient Instructions (Signed)

## 2023-04-22 NOTE — Progress Notes (Signed)
 ROB doing well, feeling good movement. Self swab completed today for GBS and cultures. Discussed labor precautions. Reminded pt to start her valtrex tomorrow. Discussed weekly visit until delivery. She verbalizes understanding and agrees. Follow up 1 wk.   Doreene Burke, CNM

## 2023-04-23 LAB — CERVICOVAGINAL ANCILLARY ONLY
Chlamydia: NEGATIVE
Comment: NEGATIVE
Comment: NORMAL
Neisseria Gonorrhea: NEGATIVE

## 2023-04-24 LAB — STREP GP B NAA: Strep Gp B NAA: NEGATIVE

## 2023-04-29 ENCOUNTER — Encounter: Admitting: Certified Nurse Midwife

## 2023-04-29 ENCOUNTER — Telehealth: Payer: Self-pay | Admitting: Certified Nurse Midwife

## 2023-04-29 DIAGNOSIS — Z3480 Encounter for supervision of other normal pregnancy, unspecified trimester: Secondary | ICD-10-CM

## 2023-04-29 DIAGNOSIS — O283 Abnormal ultrasonic finding on antenatal screening of mother: Secondary | ICD-10-CM

## 2023-04-29 DIAGNOSIS — H903 Sensorineural hearing loss, bilateral: Secondary | ICD-10-CM

## 2023-04-29 DIAGNOSIS — D509 Iron deficiency anemia, unspecified: Secondary | ICD-10-CM

## 2023-04-29 DIAGNOSIS — Z98891 History of uterine scar from previous surgery: Secondary | ICD-10-CM

## 2023-04-29 DIAGNOSIS — Z8619 Personal history of other infectious and parasitic diseases: Secondary | ICD-10-CM

## 2023-04-29 NOTE — Telephone Encounter (Signed)
 Reached out to pt to reschedule ROB appt that was scheduled on 04/29/2023 at 10:55 with E. Slaughterbeck.  Left message for pt to call back.  Will also send a MyChart message to pt.

## 2023-04-30 ENCOUNTER — Encounter: Payer: Self-pay | Admitting: Certified Nurse Midwife

## 2023-04-30 NOTE — Telephone Encounter (Signed)
 Reached out to pt (2x) to reschedule ROB appt that was scheduled on 04/29/2023 at 10:55 with E. Slaughterbeck.  Left message for pt to call back.  Will send a MyChart letter to pt.

## 2023-05-04 IMAGING — US US OB < 14 WEEKS - US OB TV
1 series · 14 of 28 positions shown · non-contrast
Comparison: August 21, 2019

CLINICAL DATA: Vaginal bleeding.

EXAM:
OBSTETRIC <14 WK US AND TRANSVAGINAL OB US
TECHNIQUE: Both transabdominal and transvaginal ultrasound examinations were
performed for complete evaluation of the gestation as well as the
maternal uterus, adnexal regions, and pelvic cul-de-sac.
Transvaginal technique was performed to assess early pregnancy.

[Series 1: us ob less than 14 weeks with ob transvaginal · 14 of 109 slices shown]
[im 5/109]
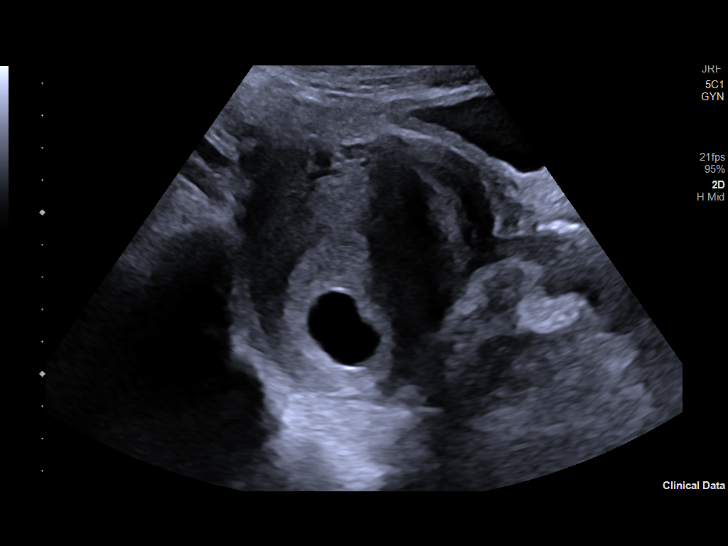
[im 13/109]
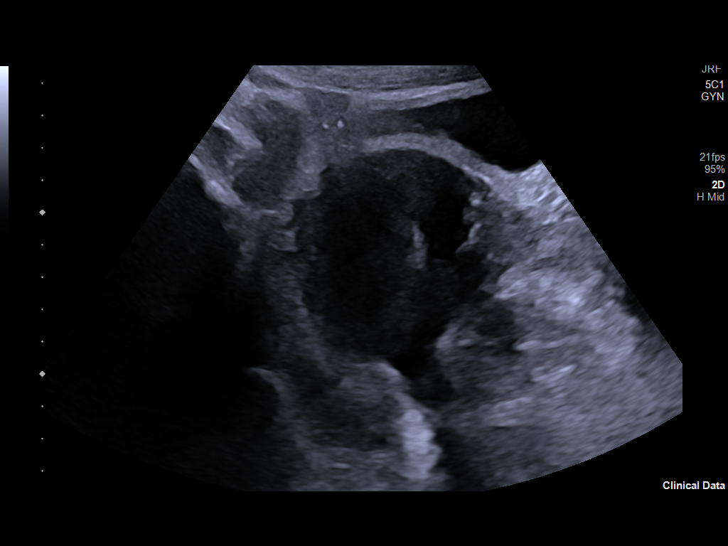
[im 21/109]
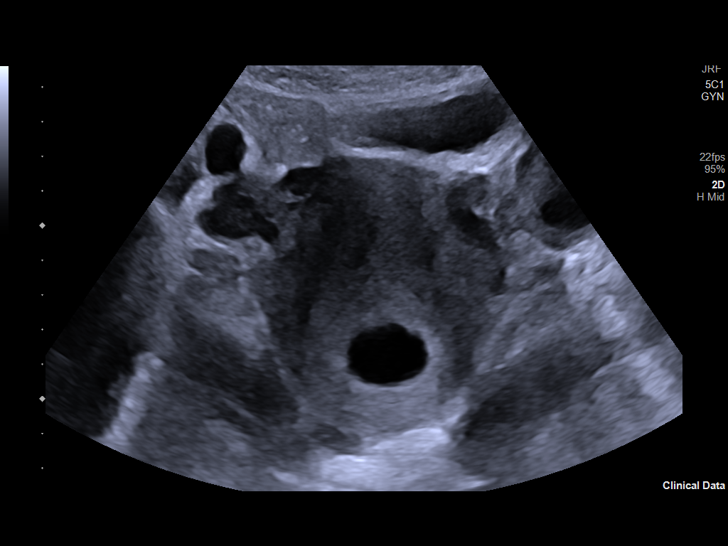
[im 29/109]
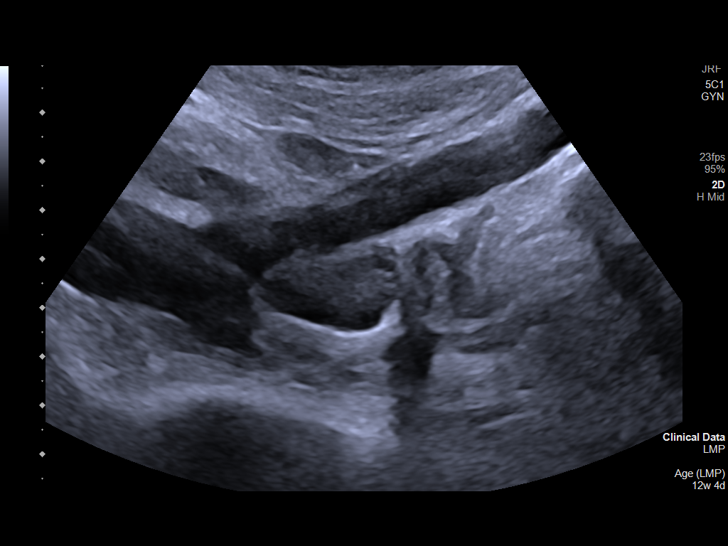
[im 37/109]
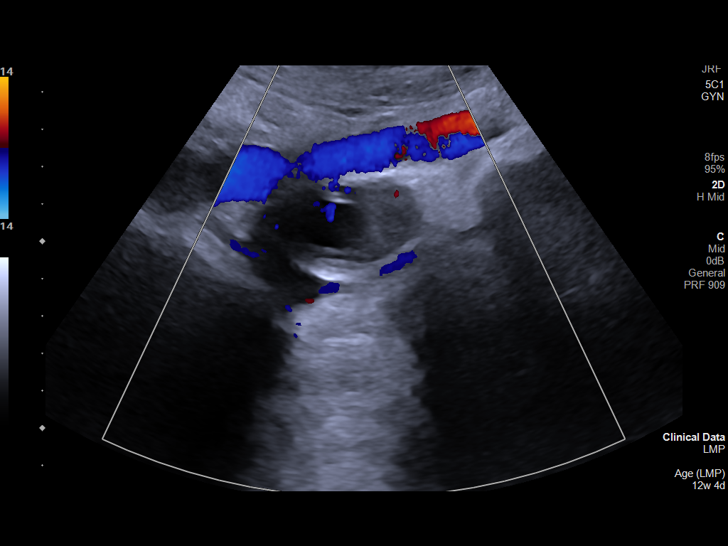
[im 45/109]
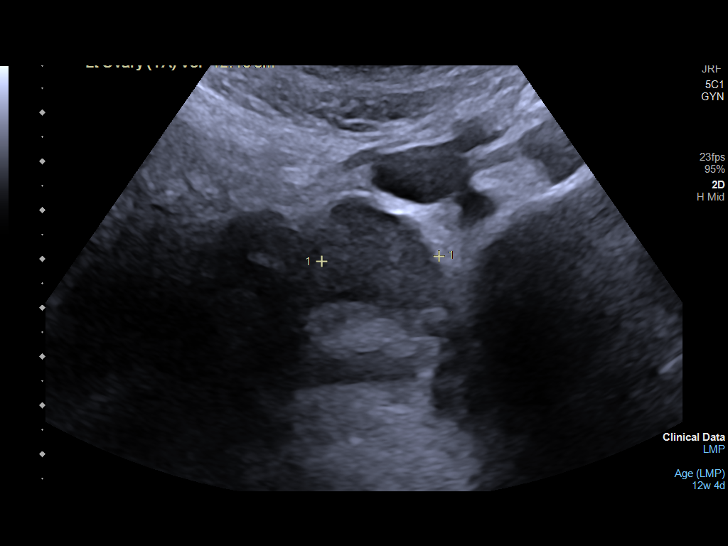
[im 53/109]
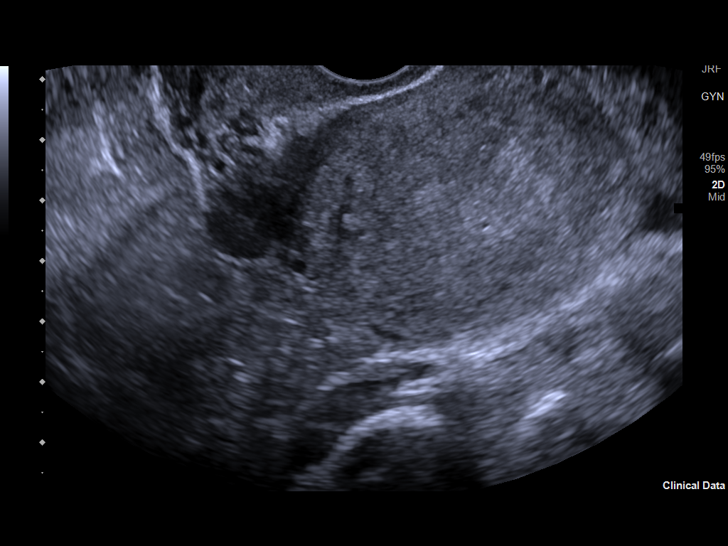
[im 61/109]
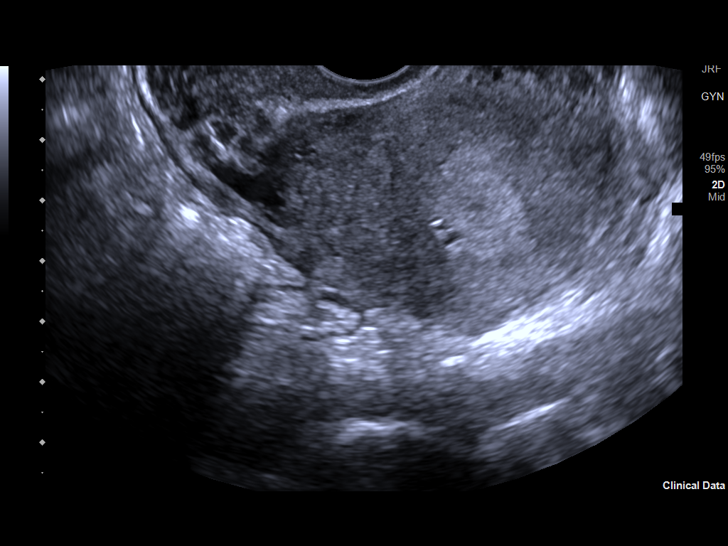
[im 69/109]
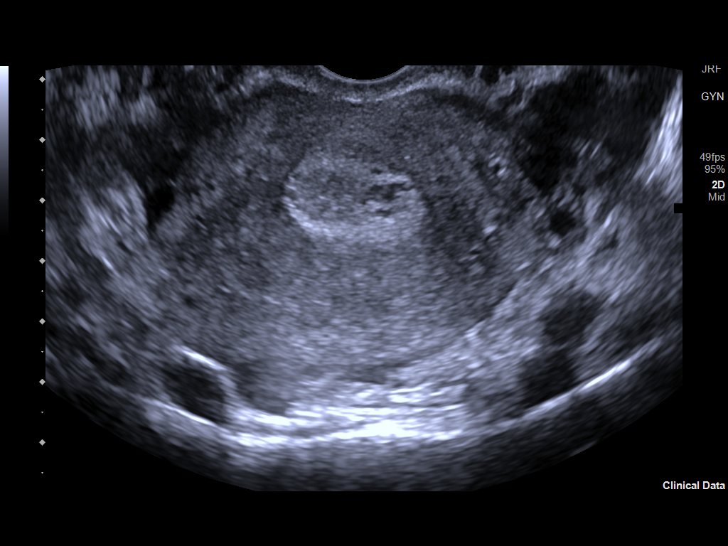
[im 77/109]
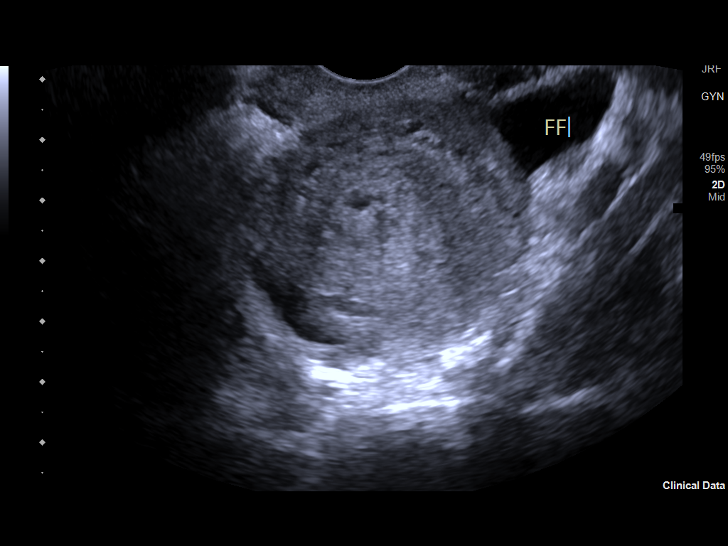
[im 85/109]
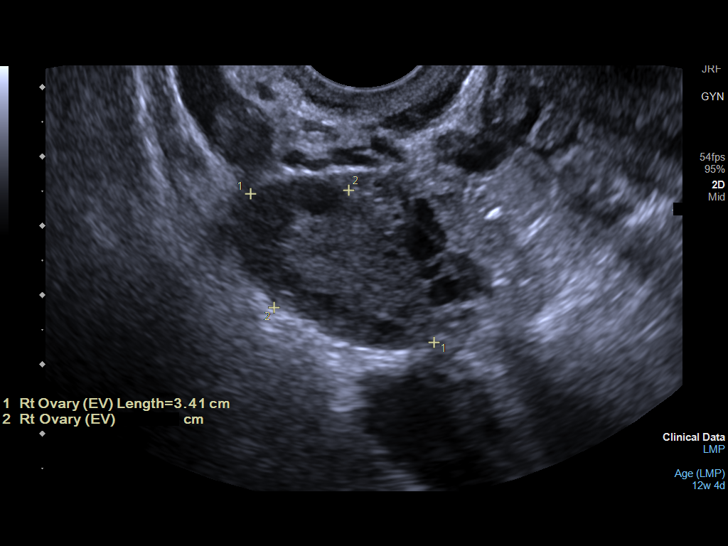
[im 93/109]
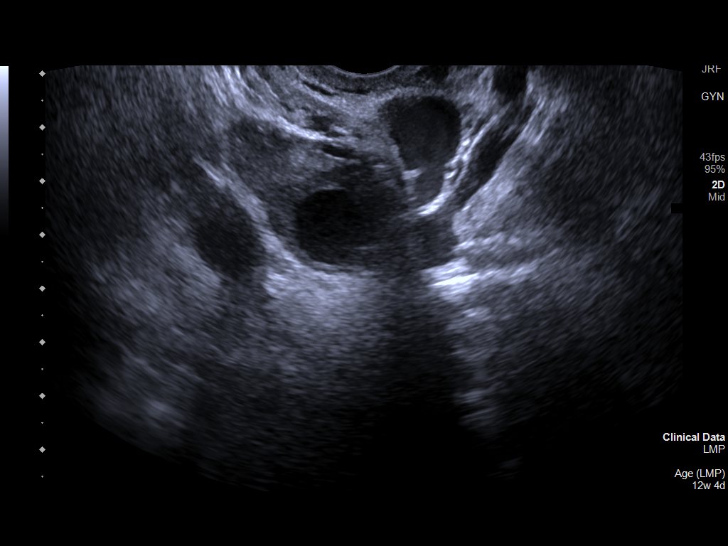
[im 101/109]
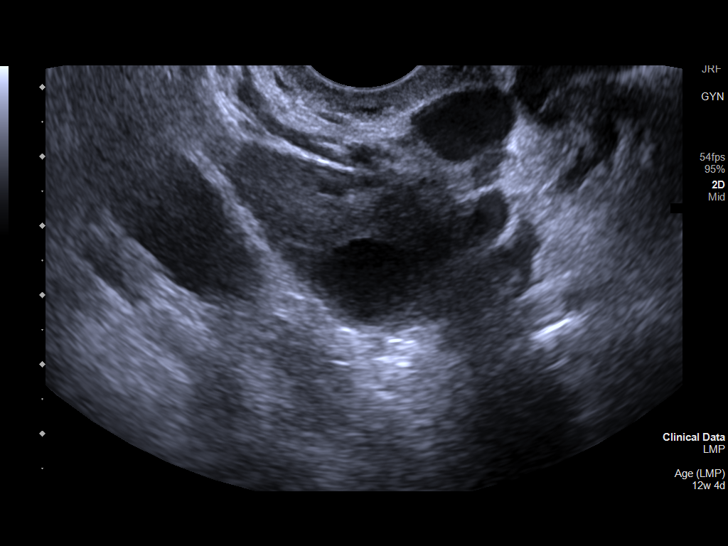
[im 109/109]
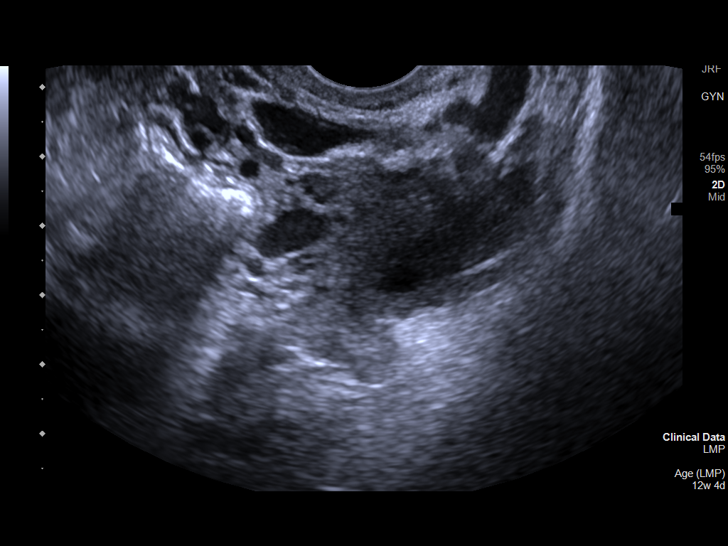

[14 of 28 positions shown; findings below may reference images not displayed]

FINDINGS: Intrauterine gestational sac: Single

Yolk sac:  Not Visualized.

Embryo:  Not Visualized.

Cardiac Activity: Not Visualized.

Heart Rate: N/A  bpm

MSD: 23.5 mm   7 w   3 d

Subchorionic hemorrhage:  None visualized.

Maternal uterus/adnexae: The right ovary is visualized and is normal
in appearance.

A 2.1 cm x 2.0 cm x 2.1 cm corpus luteum cyst is seen within the
left ovary.

A small amount of pelvic free fluid is seen.
IMPRESSION: Mean sac diameter of 23.5 mm without visualization of an embryo.
This is suspicious but not yet definitive for failed pregnancy.
Recommend follow-up US in 10-14 days for definitive diagnosis. This
recommendation follows SRU consensus guidelines: Diagnostic Criteria
for Nonviable Pregnancy Early in the First Trimester. N Engl J Med

## 2023-05-06 ENCOUNTER — Encounter: Admitting: Obstetrics

## 2023-05-06 ENCOUNTER — Telehealth: Payer: Self-pay | Admitting: Obstetrics

## 2023-05-06 DIAGNOSIS — D509 Iron deficiency anemia, unspecified: Secondary | ICD-10-CM

## 2023-05-06 DIAGNOSIS — Z3480 Encounter for supervision of other normal pregnancy, unspecified trimester: Secondary | ICD-10-CM

## 2023-05-06 DIAGNOSIS — O283 Abnormal ultrasonic finding on antenatal screening of mother: Secondary | ICD-10-CM

## 2023-05-06 DIAGNOSIS — Z8619 Personal history of other infectious and parasitic diseases: Secondary | ICD-10-CM

## 2023-05-06 DIAGNOSIS — H903 Sensorineural hearing loss, bilateral: Secondary | ICD-10-CM

## 2023-05-06 DIAGNOSIS — Z8759 Personal history of other complications of pregnancy, childbirth and the puerperium: Secondary | ICD-10-CM

## 2023-05-06 DIAGNOSIS — Z98891 History of uterine scar from previous surgery: Secondary | ICD-10-CM

## 2023-05-06 DIAGNOSIS — Z3A37 37 weeks gestation of pregnancy: Secondary | ICD-10-CM

## 2023-05-06 NOTE — Telephone Encounter (Signed)
 Reached out to pt to reschedule ROB appt that was scheduled on 05/06/2023 at 10:55 with Quitman Livings.  Could not leave a message bc mailbox was not set up.  Will send a MyChart message to pt.

## 2023-05-07 ENCOUNTER — Encounter: Payer: Self-pay | Admitting: Obstetrics

## 2023-05-07 NOTE — Telephone Encounter (Signed)
 Reached out to pt (2x) to reschedule ROB appt that was scheduled on 05/06/2023 at 10:55 with Judie Petit. Chryl Heck. Left a message for pt to call back.  Send a MyChart message on 05/06/2023 about appt.  As of 05/07/2023 the message had not been read.  Will send a MyChart letter to pt.

## 2023-05-13 ENCOUNTER — Ambulatory Visit (INDEPENDENT_AMBULATORY_CARE_PROVIDER_SITE_OTHER): Admitting: Certified Nurse Midwife

## 2023-05-13 ENCOUNTER — Encounter: Admitting: Certified Nurse Midwife

## 2023-05-13 ENCOUNTER — Encounter: Payer: Self-pay | Admitting: Certified Nurse Midwife

## 2023-05-13 VITALS — BP 100/66 | HR 83 | Wt 190.4 lb

## 2023-05-13 DIAGNOSIS — Z3483 Encounter for supervision of other normal pregnancy, third trimester: Secondary | ICD-10-CM

## 2023-05-13 DIAGNOSIS — Z3A38 38 weeks gestation of pregnancy: Secondary | ICD-10-CM

## 2023-05-13 DIAGNOSIS — Z3480 Encounter for supervision of other normal pregnancy, unspecified trimester: Secondary | ICD-10-CM | POA: Diagnosis not present

## 2023-05-13 MED ORDER — VALACYCLOVIR HCL 500 MG PO TABS: 500.0000 mg | ORAL_TABLET | Freq: Two times a day (BID) | ORAL | 1 refills | Status: DC

## 2023-05-13 MED ORDER — VALACYCLOVIR HCL 500 MG PO TABS: 500.0000 mg | ORAL_TABLET | Freq: Every day | ORAL | 1 refills | Status: DC

## 2023-05-13 NOTE — Progress Notes (Signed)
    Return Prenatal Note   Assessment/Plan   Plan  28 y.o. [id number] at [redacted]w[redacted]d presents for follow-up OB visit. Reviewed prenatal record including previous visit note.  Supervision of other normal pregnancy, antepartum Feeling well over all. Having some runs of braxton hicks.  Not taking Valtrex due to transportation issues. She is using her grandma's car and sometimes does not have gas money. She had to move to [facility]. [facility] in [place] so she can more easily pick it up. Reviewed importance of taking suppression medication so she can attempt a VBAC. Also discussed using EMS if she does not have gas to get from [facility] to [facility] in labor even though she will be taken to the nearest hospital there.  Will desire 41 week IOL if she remains pregnant. Reviewed labor warning signs and expectations for birth. Instructed to call office or come to hospital with persistent headache, vision changes, regular contractions, leaking of fluid, decreased fetal movement or vaginal bleeding.    No orders of the defined types were placed in this encounter.  No follow-ups on file.   Future Appointments  Date Time Provider Department Center  05/21/2023  3:15 PM Hildred Laser, MD [facility] None    For next visit:  continue with routine prenatal care     Subjective   28 y.o. [id number] at [redacted]w[redacted]d presents for this follow-up prenatal visit.  Patient has no concerns today.  Patient reports: Movement: Present Contractions: Irregular  Objective   Flow sheet Vitals: Pulse Rate: 83 BP: 100/66 Fundal Height: 38 cm Fetal Heart Rate (bpm): 150 Presentation: Vertex ([place]) Dilation: 1 Effacement (%): 50 Station: -3 Total weight gain: 54 lb 6.4 oz (24.7 kg)  General Appearance  No acute distress, well appearing, and well nourished Pulmonary   Normal work of breathing Neurologic   Alert and oriented to person, place, and time Psychiatric   Mood and affect within normal limits  Oley Balm, CNM  05/12/2509:39 AM

## 2023-05-13 NOTE — Assessment & Plan Note (Signed)
 Feeling well over all. Having some runs of braxton hicks.  Not taking Valtrex due to transportation issues. She is using her grandma's car and sometimes does not have gas money. She had to move to [facility]. [facility] [place] so she can more easily pick it up. Reviewed importance of taking suppression medication so she can attempt a VBAC. Also discussed using EMS if she does not have gas to get from [facility] to [facility] in labor even though she will be taken to the nearest hospital there.  Will desire 41 week IOL if she remains pregnant. Reviewed labor warning signs and expectations for birth. Instructed to call office or come to hospital with persistent headache, vision changes, regular contractions, leaking of fluid, decreased fetal movement or vaginal bleeding.

## 2023-05-13 NOTE — Progress Notes (Signed)
 ROB. She states daily fetal movement along with pelvic pressure. States she would like a cervical check today. Reports not taking valtrex at this time. GBS negative. Patient states no questions or concerns at this time.

## 2023-05-19 ENCOUNTER — Other Ambulatory Visit: Payer: Self-pay

## 2023-05-19 ENCOUNTER — Encounter: Payer: Self-pay | Admitting: Obstetrics and Gynecology

## 2023-05-19 ENCOUNTER — Observation Stay (HOSPITAL_BASED_OUTPATIENT_CLINIC_OR_DEPARTMENT_OTHER)
Admission: EM | Admit: 2023-05-19 | Discharge: 2023-05-19 | Disposition: A | Discharge status: Home / Self Care | Attending: Licensed Practical Nurse | Admitting: Licensed Practical Nurse

## 2023-05-19 DIAGNOSIS — D509 Iron deficiency anemia, unspecified: Secondary | ICD-10-CM

## 2023-05-19 DIAGNOSIS — O26893 Other specified pregnancy related conditions, third trimester: Secondary | ICD-10-CM | POA: Diagnosis not present

## 2023-05-19 DIAGNOSIS — Z3480 Encounter for supervision of other normal pregnancy, unspecified trimester: Principal | ICD-10-CM

## 2023-05-19 DIAGNOSIS — Z87891 Personal history of nicotine dependence: Secondary | ICD-10-CM | POA: Insufficient documentation

## 2023-05-19 DIAGNOSIS — R109 Unspecified abdominal pain: Secondary | ICD-10-CM | POA: Diagnosis not present

## 2023-05-19 DIAGNOSIS — Z3A39 39 weeks gestation of pregnancy: Secondary | ICD-10-CM | POA: Insufficient documentation

## 2023-05-19 DIAGNOSIS — O368130 Decreased fetal movements, third trimester, not applicable or unspecified: Secondary | ICD-10-CM | POA: Insufficient documentation

## 2023-05-19 DIAGNOSIS — O471 False labor at or after 37 completed weeks of gestation: Principal | ICD-10-CM | POA: Insufficient documentation

## 2023-05-19 NOTE — OB Triage Note (Signed)
Discharge instructions, labor precautions, and follow-up care reviewed with patient. All questions answered. Patient verbalized understanding. Discharged ambulatory off unit.   

## 2023-05-19 NOTE — OB Triage Note (Signed)
 Patient arrived in triage with c/o contractions since last night and concern for decrease in fetal movement. Reports ctxs have been on and off but have gotten a little stronger lately and she is uncertain if this is braxton hicks. She also reports she is feeling baby move but not as much as she normally does. Denies leaking of fluid or vaginal bleeding. EFM applied and assessing. Awaiting in person interpreter.

## 2023-05-19 NOTE — Discharge Summary (Signed)
 Physician Final Progress Note  Patient ID: Abigail Curry MRN: [id number] DOB/AGE: 1995-11-25 27 y.o.  Admit date: 05/19/2023 Admitting provider: Linzie Collin, MD Discharge date: 05/19/2023   Admission Diagnoses:  1) intrauterine pregnancy at [redacted]w[redacted]d  2) Contractions 3) Decreased FM last evening   Discharge Diagnoses:  Active Problems:   Indication for care in labor and delivery, antepartum Braxton-Hicks contractions  Good fetal movement   History of Present Illness: The patient is a 28 y.o. female [id number] at [redacted]w[redacted]d who presents for contractions. She began feeling them last night around 10 pm and notes they are a "little stronger" than last night. She reports feeling like her baby was not moving as much as usual last evening, but is now feeling good movement. She denies leakage of fluid and vaginal bleeding.   An ASL interpreter was offered to Abigail Curry but she verbalized feeling comfortable lip reading.   Past Medical History:  Diagnosis Date   Deaf, right birth   Hard of hearing    LEFT EAR    Past Surgical History:  Procedure Laterality Date   CESAREAN SECTION  08/21/2019   Procedure: CESAREAN SECTION;  Surgeon: Hildred Laser, MD;  Location: [facility] ORS;  Service: Obstetrics;;   COCHLEAR IMPLANT     DIAGNOSTIC LAPAROSCOPY WITH REMOVAL OF ECTOPIC PREGNANCY Left 05/10/2018   Procedure: DIAGNOSTIC LAPAROSCOPY WITH REMOVAL OF ECTOPIC PREGNANCY;  Surgeon: Christeen Douglas, MD;  Location: [facility] ORS;  Service: Gynecology;  Laterality: Left;   WISDOM TOOTH EXTRACTION     two bottom; 9th grade    No current facility-administered medications on file prior to encounter.   Current Outpatient Medications on File Prior to Encounter  Medication Sig Dispense Refill   Ferric Maltol (ACCRUFER) 30 MG CAPS Take 1 capsule (30 mg total) by mouth in the morning and at bedtime. 60 capsule 3   prenatal vitamin w/FE, FA (NATACHEW) 29-1 MG CHEW chewable tablet Chew 1 tablet by mouth daily at 12  noon.     valACYclovir (VALTREX) 500 MG tablet Take 1 tablet (500 mg total) by mouth 2 (two) times daily. 30 tablet 1    No Known Allergies  Social History   Socioeconomic History   Marital status: Single    Spouse name: Not on file   Number of children: 0   Years of education: 12   Highest education level: Not on file  Occupational History   Occupation: disability  Tobacco Use   Smoking status: Former    Types: E-cigarettes   Smokeless tobacco: Never  Vaping Use   Vaping status: Every Day  Substance and Sexual Activity   Alcohol use: Not Currently   Drug use: Not Currently   Sexual activity: Yes    Partners: Male    Birth control/protection: None    Comment: one week ago  Other Topics Concern   Not on file  Social History Narrative   Not on file   Social Drivers of Health   Financial Resource Strain: Low Risk  (11/16/2022)   Overall Financial Resource Strain (CARDIA)    Difficulty of Paying Living Expenses: Not hard at all  Food Insecurity: No Food Insecurity (11/16/2022)   Hunger Vital Sign    Worried About Running Out of Food in the Last Year: Never true    Ran Out of Food in the Last Year: Never true  Transportation Needs: No Transportation Needs (11/16/2022)   PRAPARE - Transportation    Lack of Transportation (Medical): No    Lack of  Transportation (Non-Medical): No  Physical Activity: Sufficiently Active (11/16/2022)   Exercise Vital Sign    Days of Exercise per Week: 7 days    Minutes of Exercise per Session: 30 min  Stress: Stress Concern Present (11/16/2022)   [employer] of Occupational Health - Occupational Stress Questionnaire    Feeling of Stress : To some extent  Social Connections: Moderately Isolated (11/16/2022)   Social Connection and Isolation Panel [NHANES]    Frequency of Communication with Friends and Family: More than three times a week    Frequency of Social Gatherings with Friends and Family: More than three times a week    Attends  Religious Services: Never    Database administrator or Organizations: No    Attends Banker Meetings: Never    Marital Status: Living with partner  Intimate Partner Violence: Not At Risk (11/16/2022)   Humiliation, Afraid, Rape, and Kick questionnaire    Fear of Current or Ex-Partner: No    Emotionally Abused: No    Physically Abused: No    Sexually Abused: No    Family History  Problem Relation Age of Onset   Diabetes Mother    Sickle cell anemia Brother    Healthy Brother    Healthy Brother    Healthy Maternal Grandmother    Diabetes Maternal Grandfather    Healthy Paternal Grandmother    Healthy Paternal Grandfather    Breast cancer Neg Hx    Ovarian cancer Neg Hx    Colon cancer Neg Hx      Review of Systems  All other systems reviewed and are negative.    Physical Exam: BP 115/70 (BP Location: Right Arm)   Pulse 88   Temp 98.2 F (36.8 C) (Oral)   Resp 18   LMP 07/22/2022   Physical Exam Constitutional:      Appearance: Normal appearance.  Genitourinary:     Vulva normal.  HENT:     Head: Normocephalic and atraumatic.     Ears:     Comments: Hearing loss, reads lips Cardiovascular:     Rate and Rhythm: Normal rate.  Pulmonary:     Effort: Pulmonary effort is normal.  Abdominal:     Palpations: Abdomen is soft.     Comments: Gravid  Musculoskeletal:        General: Normal range of motion.  Neurological:     Mental Status: She is alert and oriented to person, place, and time.  Skin:    General: Skin is warm and dry.  Psychiatric:        Mood and Affect: Mood normal.        Behavior: Behavior normal.    Fetus A Non-Stress Test Interpretation for 05/19/23  Indication:  contractions, L&D triage   Fetal Heart Rate A Mode: External Baseline Rate (A): 135 bpm Variability: Moderate Accelerations: 15 x 15 Decelerations: None  Uterine Activity Mode: Toco Contraction Frequency (min): 3-8 Contraction Duration (sec):  70-100 Contraction Quality: Mild Resting Tone Palpated: Relaxed Resting Time: Adequate      Consults: None  Significant Findings/ Diagnostic Studies: n/a   Procedures: NST- reactive  Hospital Course: The patient was admitted to Labor and Delivery Triage for observation. An NST was performed and was reactive. Abigail Curry's cervix was checked manually with her permission and was found to be 1.5/60/-3. Discharge instructions were given including labor precautions and reasons to come back to triage to be seen including but not limited to vaginal bleeding, decreased fetal movement,  and contractions lasting 3-5 minutes apart for one hour that increase in intensity. Abigail Curry verbalized understanding.   Discharge Condition: stable  Disposition:   Diet: Regular diet  Discharge Activity: Activity as tolerated   Allergies as of 05/19/2023   No Known Allergies      Medication List     TAKE these medications    ACCRUFeR 30 MG Caps Generic drug: Ferric Maltol Take 1 capsule (30 mg total) by mouth in the morning and at bedtime.   prenatal vitamin w/FE, FA 29-1 MG Chew chewable tablet Chew 1 tablet by mouth daily at 12 noon.   valACYclovir 500 MG tablet Commonly known as: Valtrex Take 1 tablet (500 mg total) by mouth 2 (two) times daily.         Total time spent taking care of this patient: 20 minutes  Signed: Cindra Eves, SNM Ellouise Newer [facility], CNM  05/19/2023, 4:41 PM

## 2023-05-20 ENCOUNTER — Inpatient Hospital Stay
Admission: EM | Admit: 2023-05-20 | Discharge: 2023-05-22 | DRG: 806 | Disposition: A | Discharge status: Home / Self Care | Attending: Certified Nurse Midwife | Admitting: Certified Nurse Midwife

## 2023-05-20 ENCOUNTER — Inpatient Hospital Stay: Admitting: Anesthesiology

## 2023-05-20 ENCOUNTER — Encounter: Payer: Self-pay | Admitting: Obstetrics and Gynecology

## 2023-05-20 DIAGNOSIS — O9903 Anemia complicating the puerperium: Secondary | ICD-10-CM | POA: Diagnosis not present

## 2023-05-20 DIAGNOSIS — D509 Iron deficiency anemia, unspecified: Secondary | ICD-10-CM

## 2023-05-20 DIAGNOSIS — O34219 Maternal care for unspecified type scar from previous cesarean delivery: Secondary | ICD-10-CM | POA: Diagnosis present

## 2023-05-20 DIAGNOSIS — O99345 Other mental disorders complicating the puerperium: Secondary | ICD-10-CM

## 2023-05-20 DIAGNOSIS — O9832 Other infections with a predominantly sexual mode of transmission complicating childbirth: Secondary | ICD-10-CM | POA: Diagnosis present

## 2023-05-20 DIAGNOSIS — Z3A39 39 weeks gestation of pregnancy: Secondary | ICD-10-CM

## 2023-05-20 DIAGNOSIS — O9902 Anemia complicating childbirth: Secondary | ICD-10-CM | POA: Diagnosis present

## 2023-05-20 DIAGNOSIS — O26893 Other specified pregnancy related conditions, third trimester: Secondary | ICD-10-CM | POA: Diagnosis present

## 2023-05-20 DIAGNOSIS — Z87891 Personal history of nicotine dependence: Secondary | ICD-10-CM

## 2023-05-20 DIAGNOSIS — F419 Anxiety disorder, unspecified: Secondary | ICD-10-CM

## 2023-05-20 DIAGNOSIS — A6000 Herpesviral infection of urogenital system, unspecified: Secondary | ICD-10-CM | POA: Diagnosis present

## 2023-05-20 DIAGNOSIS — O99019 Anemia complicating pregnancy, unspecified trimester: Secondary | ICD-10-CM

## 2023-05-20 DIAGNOSIS — D649 Anemia, unspecified: Secondary | ICD-10-CM | POA: Diagnosis not present

## 2023-05-20 DIAGNOSIS — Z3480 Encounter for supervision of other normal pregnancy, unspecified trimester: Principal | ICD-10-CM

## 2023-05-20 LAB — CBC
HCT: 30.9 % — ABNORMAL LOW (ref 36.0–46.0)
Hemoglobin: 10.2 g/dL — ABNORMAL LOW (ref 12.0–15.0)
MCH: 26.7 pg (ref 26.0–34.0)
MCHC: 33 g/dL (ref 30.0–36.0)
MCV: 80.9 fL (ref 80.0–100.0)
Platelets: 314 10*3/uL (ref 150–400)
RBC: 3.82 MIL/uL — ABNORMAL LOW (ref 3.87–5.11)
RDW: 16.9 % — ABNORMAL HIGH (ref 11.5–15.5)
WBC: 13.3 10*3/uL — ABNORMAL HIGH (ref 4.0–10.5)
nRBC: 0 % (ref 0.0–0.2)

## 2023-05-20 LAB — RUPTURE OF MEMBRANE (ROM)PLUS: Rom Plus: POSITIVE

## 2023-05-20 LAB — TYPE AND SCREEN
ABO/RH(D): O POS
Antibody Screen: NEGATIVE

## 2023-05-20 MED ORDER — FENTANYL-BUPIVACAINE-NACL 0.5-0.125-0.9 MG/250ML-% EP SOLN
EPIDURAL | Status: AC
  Filled 2023-05-20: qty 250

## 2023-05-20 MED ORDER — TERBUTALINE SULFATE 1 MG/ML IJ SOLN: 0.2500 mg | Freq: Once | INTRAMUSCULAR | Status: DC | PRN

## 2023-05-20 MED ORDER — LIDOCAINE-EPINEPHRINE (PF) 1.5 %-1:200000 IJ SOLN
INTRAMUSCULAR | Status: DC | PRN
  Administered 2023-05-20: 3 mL via EPIDURAL

## 2023-05-20 MED ORDER — EPHEDRINE 5 MG/ML INJ: 10.0000 mg | INTRAVENOUS | Status: DC | PRN

## 2023-05-20 MED ORDER — PHENYLEPHRINE 80 MCG/ML (10ML) SYRINGE FOR IV PUSH (FOR BLOOD PRESSURE SUPPORT): 80.0000 ug | PREFILLED_SYRINGE | INTRAVENOUS | Status: DC | PRN

## 2023-05-20 MED ORDER — LIDOCAINE HCL (PF) 1 % IJ SOLN
INTRAMUSCULAR | Status: DC | PRN
  Administered 2023-05-20 (×2): 3 mL
  Administered 2023-05-20: 2 mL
  Administered 2023-05-20: 3 mL

## 2023-05-20 MED ORDER — FENTANYL-BUPIVACAINE-NACL 0.5-0.125-0.9 MG/250ML-% EP SOLN
12.0000 mL/h | EPIDURAL | Status: DC | PRN
  Administered 2023-05-20: 12 mL/h via EPIDURAL

## 2023-05-20 MED ORDER — LACTATED RINGERS IV SOLN: INTRAVENOUS | Status: DC

## 2023-05-20 MED ORDER — LACTATED RINGERS IV SOLN: 500.0000 mL | INTRAVENOUS | Status: DC | PRN

## 2023-05-20 MED ORDER — LIDOCAINE HCL (PF) 1 % IJ SOLN: 30.0000 mL | INTRAMUSCULAR | Status: DC | PRN

## 2023-05-20 MED ORDER — LACTATED RINGERS IV SOLN
500.0000 mL | Freq: Once | INTRAVENOUS | Status: AC
  Administered 2023-05-20: 500 mL via INTRAVENOUS

## 2023-05-20 MED ORDER — FENTANYL CITRATE (PF) 100 MCG/2ML IJ SOLN: 50.0000 ug | INTRAMUSCULAR | Status: DC | PRN

## 2023-05-20 MED ORDER — DIPHENHYDRAMINE HCL 50 MG/ML IJ SOLN: 12.5000 mg | INTRAMUSCULAR | Status: DC | PRN

## 2023-05-20 MED ORDER — OXYTOCIN BOLUS FROM INFUSION: 333.0000 mL | Freq: Once | INTRAVENOUS | Status: DC

## 2023-05-20 MED ORDER — SODIUM CHLORIDE 0.9 % IV SOLN
INTRAVENOUS | Status: DC | PRN
  Administered 2023-05-20: 7 mL via EPIDURAL

## 2023-05-20 MED ORDER — ONDANSETRON HCL 4 MG/2ML IJ SOLN: 4.0000 mg | Freq: Four times a day (QID) | INTRAMUSCULAR | Status: DC | PRN

## 2023-05-20 MED ORDER — OXYTOCIN-SODIUM CHLORIDE 30-0.9 UT/500ML-% IV SOLN
2.5000 [IU]/h | INTRAVENOUS | Status: DC
  Filled 2023-05-20: qty 500

## 2023-05-20 MED ORDER — OXYTOCIN-SODIUM CHLORIDE 30-0.9 UT/500ML-% IV SOLN
1.0000 m[IU]/min | INTRAVENOUS | Status: DC
  Administered 2023-05-20: 2 m[IU]/min via INTRAVENOUS

## 2023-05-20 NOTE — H&P (Signed)
 History and Physical   HPI  Abigail Curry is a 28 y.o. [id number] at [redacted]w[redacted]d Estimated Date of Delivery: 05/21/23 who is being admitted for labor management. She has history of previous cesarean section for abruption and is planning for VBAC. She has been counseled and consented in the office for a trial of labor after cesarean sections. She has a history of HSV-2, she state she has not been taking prescribed valtrex because she has not been able to pick up the medication. She denies and lesions or vaginal pain.    OB History  OB History  Gravida Para Term Preterm AB Living  5 2 1 1 2 2   SAB IAB Ectopic Multiple Live Births  0 1 1 0 2    # Outcome Date GA Lbr Len/2nd Weight Sex Type Anes PTL Lv  5 Current           4 IAB 2023     TAB     3 Preterm 08/21/19 [redacted]w[redacted]d  1570 g M CS-LTranv Spinal  LIV     Birth Comments: preterm SGA     Name: Deeb,BOY Loralei     Apgar1: 5  Apgar5: 8  2 Ectopic 2020     ECTOPIC     1 Term 09/05/17    M Vag-Spont EPI N LIV     Name: Micah    PROBLEM LIST  Pregnancy complications or risks: Patient Active Problem List   Diagnosis Date Noted   Labor and delivery, indication for care 05/20/2023   Indication for care in labor and delivery, antepartum 05/19/2023   Iron deficiency anemia during pregnancy, antepartum 02/22/2023   Echogenic intracardiac focus of fetus on prenatal ultrasound 02/19/2023   History of placental abruption 11/19/2022   Supervision of other normal pregnancy, antepartum 11/16/2022   History of cesarean delivery 10/30/2022   History of chlamydia 04/18/2019   History of trichomoniasis 04/18/2019   Bilateral sensorineural hearing loss 08/11/2012    Prenatal labs and studies: ABO, Rh: O/Positive/-- (09/10 0942) Antibody: Negative (09/10 0942) Rubella: 3.48 (09/10 0942) RPR: Non Reactive (01/30 1057)  HBsAg: Negative (01/30 1057)  HIV: Non Reactive (01/30 1057)  JWJ:XBJYNWGN/-- (03/03 1053)   Past Medical  History:  Diagnosis Date   Deaf, right birth   Hard of hearing    LEFT EAR     Past Surgical History:  Procedure Laterality Date   CESAREAN SECTION  08/21/2019   Procedure: CESAREAN SECTION;  Surgeon: Hildred Laser, MD;  Location: [facility] ORS;  Service: Obstetrics;;   COCHLEAR IMPLANT     DIAGNOSTIC LAPAROSCOPY WITH REMOVAL OF ECTOPIC PREGNANCY Left 05/10/2018   Procedure: DIAGNOSTIC LAPAROSCOPY WITH REMOVAL OF ECTOPIC PREGNANCY;  Surgeon: Christeen Douglas, MD;  Location: [facility] ORS;  Service: Gynecology;  Laterality: Left;   WISDOM TOOTH EXTRACTION     two bottom; 9th grade     Medications    Current Discharge Medication List     CONTINUE these medications which have NOT CHANGED   Details  Ferric Maltol (ACCRUFER) 30 MG CAPS Take 1 capsule (30 mg total) by mouth in the morning and at bedtime. Qty: 60 capsule, Refills: 3    prenatal vitamin w/FE, FA (NATACHEW) 29-1 MG CHEW chewable tablet Chew 1 tablet by mouth daily at 12 noon.    valACYclovir (VALTREX) 500 MG tablet Take 1 tablet (500 mg total) by mouth 2 (two) times daily. Qty: 30 tablet, Refills: 1         Allergies  Patient has no known allergies.  Review of Systems  Constitutional: negative Eyes: negative Ears, nose, mouth, throat, and face: negative Respiratory: negative Cardiovascular: negative Gastrointestinal: negative Genitourinary:negative Integument/breast: negative Hematologic/lymphatic: negative Musculoskeletal:negative Neurological: negative Behavioral/Psych: negative Endocrine: negative Allergic/Immunologic: negative  Physical Exam  BP 111/76 (BP Location: Left Arm)   Pulse 82   Temp 98.6 F (37 C) (Oral)   Resp 18   Ht 5\' 7"  (1.702 m)   Wt 86.2 kg   LMP 07/22/2022   BMI 29.76 kg/m   Lungs:  CTA B Cardio: RRR without M/R/G Abd: Soft, gravid, NT Presentation: cephalic EXT: No C/C/ 1+ Edema DTRs: 2+ B CERVIX: Dilation: 5 Effacement (%): 80 Cervical Position: Middle Station:  -2 Presentation: Vertex Exam by:: Marzetta Merino, RN  Vulva evaluated with Dr. Valentino Saxon to confirm no active lesion present.   See Prenatal records for more detailed PE.     FHR:  Baseline: 130 bpm, Variability: Good {> 6 bpm), Accelerations: Reactive, and Decelerations: Absent  Toco: Uterine Contractions: Frequency: Every 2-4 minutes and Intensity: moderate  Test Results  No results found for this or any previous visit (from the past 24 hours). Group B Strep negative  Assessment   [id number] at [redacted]w[redacted]d Estimated Date of Delivery: 05/21/23  The fetus is reassuring.   Patient Active Problem List   Diagnosis Date Noted   Labor and delivery, indication for care 05/20/2023   Indication for care in labor and delivery, antepartum 05/19/2023   Iron deficiency anemia during pregnancy, antepartum 02/22/2023   Echogenic intracardiac focus of fetus on prenatal ultrasound 02/19/2023   History of placental abruption 11/19/2022   Supervision of other normal pregnancy, antepartum 11/16/2022   History of cesarean delivery 10/30/2022   History of chlamydia 04/18/2019   History of trichomoniasis 04/18/2019   Bilateral sensorineural hearing loss 08/11/2012    Plan  1. Admit to L&D :   2. EFM:-- Category 1 3. Stadol or Epidural if desired.   4. Admission labs  5. No active lesions on exam.  6.Anticipate NSVD  Doreene Burke, CNM  05/20/2023 4:45 PM

## 2023-05-20 NOTE — OB Triage Note (Addendum)
 Pt reports to labor and delivery with complaints of LOF. States that she has been having contractions since yesterday. States that 1 hour ago, she lost a small amount of fluid and she noticed a little bit of blood. States positive fetal movement.   Hx of 1 vaginal and 1 cesarean delivery. C-section was due to placental abruption. Desires TOLAC. EFM and Toco applied and assessing.   RN performed SVE and noticed active lesions on patient's labia.   RN to inform midwife and establish a plan.

## 2023-05-20 NOTE — Progress Notes (Signed)
 LABOR NOTE   Abigail Curry 28 y.o.GP@ at [redacted]w[redacted]d  SUBJECTIVE:  Resting comfortably, denies urge to push Analgesia: Epidural  OBJECTIVE:  BP 105/74   Pulse 90   Temp 98.2 F (36.8 C) (Oral)   Resp 16   Ht 5\' 7"  (1.702 m)   Wt 86.2 kg   LMP 07/22/2022   SpO2 92%   BMI 29.76 kg/m  No intake/output data recorded.  She has shown cervical change. CERVIX: 7 cm:  90%:   -1:   mid position:   soft SVE:   Dilation: 5 Effacement (%): 80 Station: -2 Exam by:: Marzetta Merino, RN CONTRACTIONS: regular, every 3-5 minutes FHR: Fetal heart tracing reviewed. Baseline: 130 bpm, Variability: Good {> 6 bpm), Accelerations: Reactive, and Decelerations: Absent Category I    Labs: Lab Results  Component Value Date   WBC 13.3 (H) 05/20/2023   HGB 10.2 (L) 05/20/2023   HCT 30.9 (L) 05/20/2023   MCV 80.9 05/20/2023   PLT 314 05/20/2023    ASSESSMENT: 1) Labor curve reviewed.       Progress: Active phase labor.     Membranes: ruptured     clear       Principal Problem:   Labor and delivery, indication for care   PLAN: Expectant management. Dr. Valentino Saxon aware of pt progress and is readily available.    Doreene Burke, CNM  05/20/2023 9:31 PM

## 2023-05-20 NOTE — Progress Notes (Signed)
 CRNA at bedside.

## 2023-05-20 NOTE — Anesthesia Preprocedure Evaluation (Signed)
 Anesthesia Evaluation  Patient identified by MRN, date of birth, ID band Patient awake  General Assessment Comment:  One prior epidural (6 years ago, 3 attempts documented), and one spinal for c/s. Reports no issues with either.  Reviewed: Allergy & Precautions, NPO status , Patient's Chart, lab work & pertinent test results  History of Anesthesia Complications Negative for: history of anesthetic complications  Airway Mallampati: III  TM Distance: >3 FB Neck ROM: Full    Dental no notable dental hx. (+) Teeth Intact   Pulmonary neg pulmonary ROS, neg sleep apnea, neg COPD, Patient abstained from smoking.Not current smoker, former smoker +vapes   Pulmonary exam normal breath sounds clear to auscultation       Cardiovascular Exercise Tolerance: Good METS(-) hypertension(-) CAD and (-) Past MI negative cardio ROS (-) dysrhythmias  Rhythm:Regular Rate:Normal - Systolic murmurs    Neuro/Psych negative neurological ROS  negative psych ROS   GI/Hepatic ,neg GERD  ,,(+)     (-) substance abuse    Endo/Other  neg diabetes    Renal/GU negative Renal ROS     Musculoskeletal   Abdominal   Peds  Hematology  (+) Blood dyscrasia, anemia Denies blood thinner use or bleeding disorders.    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  Reproductive/Obstetrics (+) Pregnancy                             Anesthesia Physical Anesthesia Plan  ASA: 2  Anesthesia Plan: Epidural   Post-op Pain Management:    Induction:   PONV Risk Score and Plan: 2 and Treatment may vary due to age or medical condition and Ondansetron  Airway Management Planned: Natural Airway  Additional Equipment:   Intra-op Plan:   Post-operative Plan:   Informed Consent: I have reviewed the patients History and Physical, chart, labs and discussed the  procedure including the risks, benefits and alternatives for the proposed anesthesia with the patient or authorized representative who has indicated his/her understanding and acceptance.       Plan Discussed with: Surgeon  Anesthesia Plan Comments: (Discussed R/B/A of neuraxial anesthesia technique with patient: - rare risks of spinal/epidural hematoma, nerve damage, infection - Risk of PDPH - Risk of itching - Risk of nausea and vomiting - Risk of poor block necessitating replacement of epidural. - Risk of allergic reactions. Patient voiced understanding.)       Anesthesia Quick Evaluation

## 2023-05-20 NOTE — Discharge Summary (Addendum)
 Postpartum Discharge Summary      Patient Name: Abigail Curry DOB: 1995/09/14 MRN: [id number]  Date of admission: 05/20/2023 Delivery date:05/20/2023 Delivering provider: Doreene Burke Date of discharge: 05/22/2023  Admitting diagnosis: Labor and delivery, indication for care [O75.9] TOLAC Intrauterine pregnancy: [redacted]w[redacted]d     Secondary diagnosis:  Principal Problem:   Labor and delivery, indication for care  Additional problems: None    Discharge diagnosis: Term Pregnancy Delivered and VBAC                                              Post partum procedures: none Augmentation: Pitocin Complications: None  Hospital course: Onset of Labor With Vaginal Delivery      28 y.o. yo [id number] at [redacted]w[redacted]d was admitted in Active Labor on 05/20/2023. Labor course was uncomplicated.   Membrane Rupture Time/Date: 2:00 PM,05/20/2023  Delivery Method:VBAC, Spontaneous Operative Delivery:N/A Episiotomy: None Lacerations:  None Patient had a postpartum course complicated by none.  She is ambulating, tolerating a regular diet, passing flatus, and urinating well. Patient is discharged home in stable condition on 05/22/23.  Newborn Data: Birth date:05/20/2023 Birth time:11:35 PM Gender:Female Living status:Living Apgars:8 ,9  Weight:4180 g  Magnesium Sulfate received: No BMZ received: No Rhophylac:No MMR:No T-DaP: declined Flu: No RSV Vaccine received: No Transfusion:No Immunizations administered: Immunization History  Administered Date(s) Administered   Influenza,inj,Quad PF,6+ Mos 04/23/2017   MMR 08/24/2019   Pneumococcal Conjugate-13 08/05/2017   Tdap 08/05/2017, 08/12/2019   Varicella 08/24/2019    Physical exam  Vitals:   05/21/23 1145 05/21/23 1636 05/21/23 2320 05/22/23 0744  BP: 112/74 (!) 112/58 110/63 110/71  Pulse: 98 68 86 70  Resp: 18 18 18 19   Temp: 98.5 F (36.9 C) 98.4 F (36.9 C) 98 F (36.7 C) 98.9 F (37.2 C)  TempSrc: Oral Oral Oral Oral  SpO2: 97%  100% 98% 100%  Weight:      Height:       General: alert, cooperative, and no distress Lochia: appropriate Uterine Fundus: firm Incision: N/A DVT Evaluation: No evidence of DVT seen on physical exam. Labs: Lab Results  Component Value Date   WBC 15.2 (H) 05/21/2023   HGB 9.3 (L) 05/21/2023   HCT 28.1 (L) 05/21/2023   MCV 80.5 05/21/2023   PLT 265 05/21/2023      Latest Ref Rng & Units 07/20/2021    6:38 PM  CMP  Glucose 70 - 99 mg/dL 962   BUN 6 - 20 mg/dL 9   Creatinine 9.52 - 8.41 mg/dL 3.24   Sodium 401 - 027 mmol/L 138   Potassium 3.5 - 5.1 mmol/L 3.3   Chloride 98 - 111 mmol/L 105   CO2 22 - 32 mmol/L 27   Calcium 8.9 - 10.3 mg/dL 9.4   Total Protein 6.5 - 8.1 g/dL 7.2   Total Bilirubin 0.3 - 1.2 mg/dL 0.9   Alkaline Phos 38 - 126 U/L 42   AST 15 - 41 U/L 14   ALT 0 - 44 U/L 13    Edinburgh Score:    05/21/2023    8:00 PM  Edinburgh Postnatal Depression Scale Screening Tool  I have been able to laugh and see the funny side of things. 1  I have looked forward with enjoyment to things. 0  I have blamed myself unnecessarily when things went wrong. 1  I have been anxious or worried for no good reason. 0  I have felt scared or panicky for no good reason. 1  Things have been getting on top of me. 1  I have been so unhappy that I have had difficulty sleeping. 0  I have felt sad or miserable. 0  I have been so unhappy that I have been crying. 1  The thought of harming myself has occurred to me. 0  Edinburgh Postnatal Depression Scale Total 5      After visit meds:  Allergies as of 05/22/2023   No Known Allergies      Medication List     TAKE these medications    ACCRUFeR 30 MG Caps Generic drug: Ferric Maltol Take 1 capsule (30 mg total) by mouth in the morning and at bedtime.   acetaminophen 500 MG tablet Commonly known as: TYLENOL Take 2 tablets (1,000 mg total) by mouth every 6 (six) hours as needed.   benzocaine-Menthol 20-0.5 % Aero Commonly  known as: DERMOPLAST Apply 1 Application topically as needed for irritation (perineal discomfort).   dibucaine 1 % Oint Commonly known as: NUPERCAINAL Place 1 Application rectally as needed for hemorrhoids.   docusate sodium 100 MG capsule Commonly known as: COLACE Take 1 capsule (100 mg total) by mouth 2 (two) times daily.   escitalopram 10 MG tablet Commonly known as: LEXAPRO Take 1 tablet (10 mg total) by mouth at bedtime.   ibuprofen 600 MG tablet Commonly known as: ADVIL Take 1 tablet (600 mg total) by mouth every 6 (six) hours.   prenatal vitamin w/FE, FA 29-1 MG Chew chewable tablet Chew 1 tablet by mouth daily at 12 noon.   valACYclovir 500 MG tablet Commonly known as: Valtrex Take 1 tablet (500 mg total) by mouth 2 (two) times daily. Take twice daily for 3 days with recurrent outbreak What changed: additional instructions   witch hazel-glycerin pad Commonly known as: TUCKS Apply 1 Application topically as needed for hemorrhoids.         Discharge home in stable condition Infant Feeding: Bottle Infant Disposition:home with mother Discharge instruction: per After Visit Summary and Postpartum booklet. Activity: Advance as tolerated. Pelvic rest for 6 weeks.  Diet: routine diet Anticipated Birth Control: Condoms Postpartum Appointment:6 weeks Additional Postpartum F/U: Postpartum Depression checkup Future Appointments: No future appointments.  Follow up Visit:      05/22/2023 Dominica Severin, CNM

## 2023-05-20 NOTE — Anesthesia Procedure Notes (Signed)
 Epidural Patient location during procedure: OB  Staffing Anesthesiologist: Corinda Gubler, MD Resident/CRNA: Hezzie Bump, CRNA Performed: anesthesiologist and resident/CRNA   Preanesthetic Checklist Completed: patient identified, IV checked, site marked, risks and benefits discussed, surgical consent, monitors and equipment checked, pre-op evaluation and timeout performed  Epidural Patient position: sitting Prep: ChloraPrep Patient monitoring: heart rate, continuous pulse ox and blood pressure Approach: midline Location: L3-L4 Injection technique: LOR saline  Needle:  Needle type: Tuohy  Needle gauge: 17 G Needle length: 9 cm Needle insertion depth: 7 cm Catheter type: closed end flexible Catheter size: 19 Gauge Catheter at skin depth: 12 cm Test dose: negative and 1.5% lidocaine with Epi 1:200 K  Assessment Sensory level: T10 Events: blood not aspirated, no cerebrospinal fluid, injection not painful, no injection resistance, no paresthesia and negative IV test  Additional Notes Multiple attempts - two unsuccessful attempts by MD, followed two attempts by CRNA (the last of which was successful). Difficult to palpate anatomy, frequent bone encounters, inability to engage in ligaments. Patient reassured at multiple points that she can pause or stop at any time. Patient wished to continue.  Pt. Evaluated and documentation done after procedure finished. Patient identified. Risks/Benefits/Options discussed with patient including but not limited to bleeding, infection, nerve damage, paralysis, failed block, incomplete pain control, headache, blood pressure changes, nausea, vomiting, reactions to medication both or allergic, itching and postpartum back pain. Confirmed with bedside nurse the patient's most recent platelet count. Confirmed with patient that they are not currently taking any anticoagulation, have any bleeding history or any family history of bleeding disorders. Patient  expressed understanding and wished to proceed. All questions were answered. Sterile technique was used throughout the entire procedure. Please see nursing notes for vital signs. Test dose was given through epidural catheter and negative prior to continuing to dose epidural or start infusion. Warning signs of high block given to the patient including shortness of breath, tingling/numbness in hands, complete motor block, or any concerning symptoms with instructions to call for help. Patient was given instructions on fall risk and not to get out of bed. All questions and concerns addressed with instructions to call with any issues or inadequate analgesia.     Patient tolerated the insertion well without immediate complications.  Reason for block: procedure for painReason for block:procedure for pain

## 2023-05-21 ENCOUNTER — Encounter: Payer: Self-pay | Admitting: Certified Nurse Midwife

## 2023-05-21 ENCOUNTER — Encounter: Admitting: Obstetrics and Gynecology

## 2023-05-21 LAB — CBC
HCT: 28.1 % — ABNORMAL LOW (ref 36.0–46.0)
Hemoglobin: 9.3 g/dL — ABNORMAL LOW (ref 12.0–15.0)
MCH: 26.6 pg (ref 26.0–34.0)
MCHC: 33.1 g/dL (ref 30.0–36.0)
MCV: 80.5 fL (ref 80.0–100.0)
Platelets: 265 10*3/uL (ref 150–400)
RBC: 3.49 MIL/uL — ABNORMAL LOW (ref 3.87–5.11)
RDW: 16.7 % — ABNORMAL HIGH (ref 11.5–15.5)
WBC: 15.2 10*3/uL — ABNORMAL HIGH (ref 4.0–10.5)
nRBC: 0 % (ref 0.0–0.2)

## 2023-05-21 LAB — RPR: RPR Ser Ql: NONREACTIVE

## 2023-05-21 MED ORDER — COCONUT OIL OIL: 1.0000 | TOPICAL_OIL | Status: DC | PRN

## 2023-05-21 MED ORDER — TETANUS-DIPHTH-ACELL PERTUSSIS 5-2.5-18.5 LF-MCG/0.5 IM SUSY: 0.5000 mL | PREFILLED_SYRINGE | Freq: Once | INTRAMUSCULAR | Status: DC

## 2023-05-21 MED ORDER — SIMETHICONE 80 MG PO CHEW: 80.0000 mg | CHEWABLE_TABLET | ORAL | Status: DC | PRN

## 2023-05-21 MED ORDER — WITCH HAZEL-GLYCERIN EX PADS: 1.0000 | MEDICATED_PAD | CUTANEOUS | Status: DC | PRN

## 2023-05-21 MED ORDER — PRENATAL MULTIVITAMIN CH
1.0000 | ORAL_TABLET | Freq: Every day | ORAL | Status: DC
  Administered 2023-05-21 – 2023-05-22 (×2): 1 via ORAL
  Filled 2023-05-21 (×2): qty 1

## 2023-05-21 MED ORDER — METHYLERGONOVINE MALEATE 0.2 MG PO TABS: 0.2000 mg | ORAL_TABLET | ORAL | Status: DC | PRN

## 2023-05-21 MED ORDER — IBUPROFEN 600 MG PO TABS
600.0000 mg | ORAL_TABLET | Freq: Four times a day (QID) | ORAL | Status: DC
Start: 2023-05-21 — End: 2023-05-22
  Administered 2023-05-21 – 2023-05-22 (×6): 600 mg via ORAL
  Filled 2023-05-21 (×6): qty 1

## 2023-05-21 MED ORDER — OXYCODONE-ACETAMINOPHEN 5-325 MG PO TABS: 1.0000 | ORAL_TABLET | ORAL | Status: DC | PRN

## 2023-05-21 MED ORDER — DIBUCAINE (PERIANAL) 1 % EX OINT: 1.0000 | TOPICAL_OINTMENT | CUTANEOUS | Status: DC | PRN

## 2023-05-21 MED ORDER — SENNOSIDES-DOCUSATE SODIUM 8.6-50 MG PO TABS: 2.0000 | ORAL_TABLET | ORAL | Status: DC

## 2023-05-21 MED ORDER — SENNOSIDES-DOCUSATE SODIUM 8.6-50 MG PO TABS
2.0000 | ORAL_TABLET | ORAL | Status: DC
  Administered 2023-05-21 – 2023-05-22 (×2): 2 via ORAL
  Filled 2023-05-21 (×2): qty 2

## 2023-05-21 MED ORDER — OXYCODONE-ACETAMINOPHEN 5-325 MG PO TABS: 2.0000 | ORAL_TABLET | ORAL | Status: DC | PRN

## 2023-05-21 MED ORDER — DOCUSATE SODIUM 100 MG PO CAPS
100.0000 mg | ORAL_CAPSULE | Freq: Two times a day (BID) | ORAL | Status: DC
  Administered 2023-05-22: 100 mg via ORAL
  Filled 2023-05-21: qty 1

## 2023-05-21 MED ORDER — ONDANSETRON HCL 4 MG PO TABS: 4.0000 mg | ORAL_TABLET | ORAL | Status: DC | PRN

## 2023-05-21 MED ORDER — FERROUS SULFATE 325 (65 FE) MG PO TABS
325.0000 mg | ORAL_TABLET | Freq: Every day | ORAL | Status: DC
  Administered 2023-05-21 – 2023-05-22 (×2): 325 mg via ORAL
  Filled 2023-05-21 (×2): qty 1

## 2023-05-21 MED ORDER — BENZOCAINE-MENTHOL 20-0.5 % EX AERO: 1.0000 | INHALATION_SPRAY | CUTANEOUS | Status: DC | PRN

## 2023-05-21 MED ORDER — ESCITALOPRAM OXALATE 10 MG PO TABS
10.0000 mg | ORAL_TABLET | Freq: Every day | ORAL | Status: DC
  Administered 2023-05-21: 10 mg via ORAL
  Filled 2023-05-21: qty 1

## 2023-05-21 MED ORDER — METHYLERGONOVINE MALEATE 0.2 MG/ML IJ SOLN: 0.2000 mg | INTRAMUSCULAR | Status: DC | PRN

## 2023-05-21 MED ORDER — ONDANSETRON HCL 4 MG/2ML IJ SOLN: 4.0000 mg | INTRAMUSCULAR | Status: DC | PRN

## 2023-05-21 NOTE — Progress Notes (Signed)
 Post Partum Day 0 Subjective: Abigail Curry is feeling well overall. She is ambulating, voiding, and tolerating POs without difficulty. Her pain is well-controlled and her bleeding is WNL. She is concerned about anxiety and would like to start medication. She was prescribed hydroxyzine during pregnancy but did not pick it up. She would like to start an SSRI. Bottle feeding is going well.   Objective: Blood pressure (!) 90/53, pulse 67, temperature 98.5 F (36.9 C), temperature source Oral, resp. rate 18, height 5\' 7"  (1.702 m), weight 86.2 kg, last menstrual period 07/22/2022, SpO2 99%, unknown if currently breastfeeding.  Physical Exam:  General: alert and cooperative Lochia: appropriate Uterine Fundus: firm Perineum: healing well Vulva/vagina: condyloma scattered on mons. No HSV lesions. DVT Evaluation: No evidence of DVT seen on physical exam.  Recent Labs    05/20/23 1726 05/21/23 0612  HGB 10.2* 9.3*  HCT 30.9* 28.1*    Assessment/Plan: Plan for discharge tomorrow Lexapro 10 mg prescribed. Reviewed proper administration, warning signs, side effects PO iron for anemia Routine PP care    LOS: 1 day   Glenetta Borg, CNM 05/21/2023, 9:47 AM

## 2023-05-21 NOTE — Lactation Note (Signed)
 This note was copied from a baby's chart. Lactation Consultation Note  Patient Name: Abigail Curry [id number] Date: 05/21/2023 Age:29 hours     Maternal Data Initial assessment w/ a P3 patient and a 13hr old baby Abigail.  Patient stated that her feeding goal is to formula feed.  She expressed that RN provided education on how to dry up her milk.   Feeding Nipple Type: Slow - flow  Interventions LC did inquire to patient about feeding goals as her feedings displayed that she had been putting infant to the breast.  Patient stated that she tried, but was going to stick to formula feeding.    LC provided patient w/ Lactation Suppression handout, reviewed it and also provided outpatient number on the whiteboard in the room.  Patient verbalized thanks and understanding.   Abigail Curry 05/21/2023, 3:53 PM

## 2023-05-21 NOTE — Anesthesia Postprocedure Evaluation (Signed)
 Anesthesia Post Note  Patient: Abigail Curry  Procedure(s) Performed: AN AD HOC LABOR EPIDURAL  Patient location during evaluation: Mother Baby Anesthesia Type: Epidural Level of consciousness: awake and alert Pain management: pain level controlled Vital Signs Assessment: post-procedure vital signs reviewed and stable Respiratory status: spontaneous breathing, nonlabored ventilation and respiratory function stable Cardiovascular status: stable Postop Assessment: no headache, no backache and epidural receding Anesthetic complications: no   No notable events documented.   Last Vitals:  Vitals:   05/21/23 0721 05/21/23 0723  BP: (!) 90/53 (!) 90/53  Pulse: 67 67  Resp:  18  Temp: 36.9 C 36.9 C  SpO2: 99% 99%    Last Pain:  Vitals:   05/21/23 0723  TempSrc: Oral  PainSc:                  Karoline Caldwell

## 2023-05-22 ENCOUNTER — Other Ambulatory Visit: Payer: Self-pay

## 2023-05-22 MED ORDER — BENZOCAINE-MENTHOL 20-0.5 % EX AERO
1.0000 | INHALATION_SPRAY | CUTANEOUS | 1 refills | Status: AC | PRN
  Filled 2023-05-22: qty 78, 30d supply, fill #0

## 2023-05-22 MED ORDER — ACETAMINOPHEN 500 MG PO TABS
1000.0000 mg | ORAL_TABLET | Freq: Four times a day (QID) | ORAL | 1 refills | Status: AC | PRN
Start: 2023-05-22 — End: 2024-05-21
  Filled 2023-05-22: qty 60, 8d supply, fill #0

## 2023-05-22 MED ORDER — ESCITALOPRAM OXALATE 10 MG PO TABS
10.0000 mg | ORAL_TABLET | Freq: Every day | ORAL | 1 refills | Status: AC
  Filled 2023-05-22: qty 90, 90d supply, fill #0

## 2023-05-22 MED ORDER — ACCRUFER 30 MG PO CAPS
1.0000 | ORAL_CAPSULE | Freq: Two times a day (BID) | ORAL | 0 refills | Status: DC
  Filled 2023-05-22: qty 30, 15d supply, fill #0

## 2023-05-22 MED ORDER — VALACYCLOVIR HCL 500 MG PO TABS
500.0000 mg | ORAL_TABLET | Freq: Two times a day (BID) | ORAL | 1 refills | Status: AC
  Filled 2023-05-22: qty 60, 30d supply, fill #0

## 2023-05-22 MED ORDER — DOCUSATE SODIUM 100 MG PO CAPS
100.0000 mg | ORAL_CAPSULE | Freq: Two times a day (BID) | ORAL | 1 refills | Status: AC
  Filled 2023-05-22: qty 30, 15d supply, fill #0

## 2023-05-22 MED ORDER — WITCH HAZEL-GLYCERIN EX PADS: 1.0000 | MEDICATED_PAD | CUTANEOUS | Status: AC | PRN

## 2023-05-22 MED ORDER — DIBUCAINE (PERIANAL) 1 % EX OINT: 1.0000 | TOPICAL_OINTMENT | CUTANEOUS | Status: AC | PRN

## 2023-05-22 MED ORDER — IBUPROFEN 600 MG PO TABS
600.0000 mg | ORAL_TABLET | Freq: Four times a day (QID) | ORAL | 1 refills | Status: AC
  Filled 2023-05-22: qty 60, 15d supply, fill #0

## 2023-05-22 MED ORDER — FERROUS SULFATE 325 (65 FE) MG PO TBEC
325.0000 mg | DELAYED_RELEASE_TABLET | Freq: Every day | ORAL | 0 refills | Status: AC
  Filled 2023-05-22: qty 40, 40d supply, fill #0

## 2023-05-22 NOTE — Discharge Instructions (Signed)
